# Patient Record
Sex: Female | Born: 1985 | Race: Black or African American | Hispanic: No | Marital: Single | Smoking: Never smoker
Health system: Southern US, Community
[De-identification: ages and names within clinical notes are randomized; demographics above are authoritative.]

## PROBLEM LIST (undated history)

## (undated) DIAGNOSIS — R5600 Simple febrile convulsions: Secondary | ICD-10-CM

## (undated) DIAGNOSIS — O020 Blighted ovum and nonhydatidiform mole: Secondary | ICD-10-CM

## (undated) DIAGNOSIS — O139 Gestational [pregnancy-induced] hypertension without significant proteinuria, unspecified trimester: Secondary | ICD-10-CM

## (undated) DIAGNOSIS — I8393 Asymptomatic varicose veins of bilateral lower extremities: Secondary | ICD-10-CM

## (undated) DIAGNOSIS — E079 Disorder of thyroid, unspecified: Secondary | ICD-10-CM

## (undated) DIAGNOSIS — O43819 Placental infarction, unspecified trimester: Secondary | ICD-10-CM

## (undated) DIAGNOSIS — Z8759 Personal history of other complications of pregnancy, childbirth and the puerperium: Secondary | ICD-10-CM

## (undated) HISTORY — DX: Placental infarction, unspecified trimester: O43.819

---
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
## 2006-06-08 ENCOUNTER — Emergency Department (HOSPITAL_COMMUNITY): Admission: EM | Admit: 2006-06-08 | Discharge: 2006-06-08 | Payer: Self-pay | Admitting: Emergency Medicine

## 2006-11-16 ENCOUNTER — Ambulatory Visit (HOSPITAL_COMMUNITY): Admission: RE | Admit: 2006-11-16 | Discharge: 2006-11-16 | Payer: Self-pay | Admitting: Family Medicine

## 2006-12-07 ENCOUNTER — Ambulatory Visit (HOSPITAL_COMMUNITY): Admission: RE | Admit: 2006-12-07 | Discharge: 2006-12-07 | Payer: Self-pay | Admitting: Family Medicine

## 2006-12-28 ENCOUNTER — Ambulatory Visit (HOSPITAL_COMMUNITY): Admission: RE | Admit: 2006-12-28 | Discharge: 2006-12-28 | Payer: Self-pay | Admitting: Family Medicine

## 2007-01-26 ENCOUNTER — Ambulatory Visit (HOSPITAL_COMMUNITY): Admission: RE | Admit: 2007-01-26 | Discharge: 2007-01-26 | Payer: Self-pay | Admitting: Family Medicine

## 2007-03-02 ENCOUNTER — Ambulatory Visit (HOSPITAL_COMMUNITY): Admission: RE | Admit: 2007-03-02 | Discharge: 2007-03-02 | Payer: Self-pay | Admitting: Family Medicine

## 2007-04-13 ENCOUNTER — Ambulatory Visit (HOSPITAL_COMMUNITY): Admission: RE | Admit: 2007-04-13 | Discharge: 2007-04-13 | Payer: Self-pay | Admitting: Family Medicine

## 2007-05-04 ENCOUNTER — Inpatient Hospital Stay (HOSPITAL_COMMUNITY): Admission: AD | Admit: 2007-05-04 | Discharge: 2007-05-08 | Payer: Self-pay | Admitting: Obstetrics and Gynecology

## 2007-05-04 ENCOUNTER — Ambulatory Visit: Payer: Self-pay | Admitting: Physician Assistant

## 2007-06-27 ENCOUNTER — Emergency Department (HOSPITAL_COMMUNITY): Admission: EM | Admit: 2007-06-27 | Discharge: 2007-06-27 | Payer: Self-pay | Admitting: Emergency Medicine

## 2009-09-01 DIAGNOSIS — O020 Blighted ovum and nonhydatidiform mole: Secondary | ICD-10-CM

## 2009-09-01 HISTORY — DX: Blighted ovum and nonhydatidiform mole: O02.0

## 2009-09-01 HISTORY — PX: DILATION AND EVACUATION: SHX1459

## 2009-09-14 ENCOUNTER — Inpatient Hospital Stay (HOSPITAL_COMMUNITY): Admission: AD | Admit: 2009-09-14 | Discharge: 2009-09-14 | Payer: Self-pay | Admitting: Obstetrics & Gynecology

## 2009-09-14 ENCOUNTER — Ambulatory Visit: Payer: Self-pay | Admitting: Obstetrics and Gynecology

## 2009-09-17 ENCOUNTER — Ambulatory Visit: Payer: Self-pay | Admitting: Obstetrics & Gynecology

## 2009-09-17 ENCOUNTER — Ambulatory Visit (HOSPITAL_COMMUNITY): Admission: AD | Admit: 2009-09-17 | Discharge: 2009-09-17 | Payer: Self-pay | Admitting: Obstetrics & Gynecology

## 2009-09-21 ENCOUNTER — Emergency Department (HOSPITAL_COMMUNITY): Admission: EM | Admit: 2009-09-21 | Discharge: 2009-09-21 | Payer: Self-pay | Admitting: Emergency Medicine

## 2009-10-04 ENCOUNTER — Ambulatory Visit: Payer: Self-pay | Admitting: Obstetrics and Gynecology

## 2009-10-05 ENCOUNTER — Encounter: Payer: Self-pay | Admitting: Obstetrics and Gynecology

## 2009-10-18 ENCOUNTER — Ambulatory Visit: Payer: Self-pay | Admitting: Obstetrics and Gynecology

## 2009-10-18 LAB — CONVERTED CEMR LAB: hCG, Beta Chain, Quant, S: 25.2 milliintl units/mL

## 2009-10-25 ENCOUNTER — Ambulatory Visit: Payer: Self-pay | Admitting: Obstetrics and Gynecology

## 2009-10-26 ENCOUNTER — Encounter: Payer: Self-pay | Admitting: Obstetrics and Gynecology

## 2009-11-07 ENCOUNTER — Ambulatory Visit: Payer: Self-pay | Admitting: Obstetrics & Gynecology

## 2009-11-07 ENCOUNTER — Encounter: Payer: Self-pay | Admitting: Obstetrics and Gynecology

## 2009-11-14 ENCOUNTER — Ambulatory Visit: Payer: Self-pay | Admitting: Obstetrics & Gynecology

## 2009-12-14 ENCOUNTER — Ambulatory Visit: Payer: Self-pay | Admitting: Obstetrics & Gynecology

## 2009-12-14 ENCOUNTER — Encounter: Payer: Self-pay | Admitting: Obstetrics and Gynecology

## 2009-12-14 LAB — CONVERTED CEMR LAB: hCG, Beta Chain, Quant, S: <2 milliintl units/mL

## 2010-03-15 ENCOUNTER — Encounter: Payer: Self-pay | Admitting: Obstetrics and Gynecology

## 2010-03-15 ENCOUNTER — Ambulatory Visit: Payer: Self-pay | Admitting: Obstetrics & Gynecology

## 2010-03-15 LAB — CONVERTED CEMR LAB: hCG, Beta Chain, Quant, S: <2 milliintl units/mL

## 2010-06-21 ENCOUNTER — Ambulatory Visit: Payer: Self-pay | Admitting: Obstetrics & Gynecology

## 2010-06-21 LAB — CONVERTED CEMR LAB: hCG, Beta Chain, Quant, S: <2 milliintl units/mL

## 2010-09-16 ENCOUNTER — Ambulatory Visit
Admission: RE | Admit: 2010-09-16 | Discharge: 2010-09-16 | Payer: Self-pay | Source: Home / Self Care | Attending: Obstetrics & Gynecology | Admitting: Obstetrics & Gynecology

## 2010-09-16 ENCOUNTER — Encounter: Payer: Self-pay | Admitting: Obstetrics & Gynecology

## 2010-11-17 LAB — CBC
HCT: 34.3 % — ABNORMAL LOW (ref 36.0–46.0)
Hemoglobin: 11.6 g/dL — ABNORMAL LOW (ref 12.0–15.0)
MCHC: 34.2 g/dL (ref 30.0–36.0)
MCV: 89 fL (ref 78.0–100.0)
Platelets: 215 10*3/uL (ref 150–400)
RBC: 3.85 MIL/uL — ABNORMAL LOW (ref 3.87–5.11)
RDW: 13.5 % (ref 11.5–15.5)

## 2010-11-17 LAB — POCT I-STAT, CHEM 8
Glucose, Bld: 109 mg/dL — ABNORMAL HIGH (ref 70–99)
HCT: 34 % — ABNORMAL LOW (ref 36.0–46.0)
Hemoglobin: 11.6 g/dL — ABNORMAL LOW (ref 12.0–15.0)
TCO2: 21 mmol/L (ref 0–100)

## 2010-11-17 LAB — CROSSMATCH

## 2010-11-17 LAB — WET PREP, GENITAL
Clue Cells Wet Prep HPF POC: NONE SEEN
Yeast Wet Prep HPF POC: NONE SEEN

## 2010-11-17 LAB — T3, FREE: T3, Free: 2.5 pg/mL (ref 2.3–4.2)

## 2010-11-17 LAB — ABO/RH: ABO/RH(D): B POS

## 2010-11-17 LAB — DIFFERENTIAL
Basophils Absolute: 0 10*3/uL (ref 0.0–0.1)
Basophils Relative: 0 % (ref 0–1)
Eosinophils Absolute: 0 10*3/uL (ref 0.0–0.7)
Lymphocytes Relative: 8 % — ABNORMAL LOW (ref 12–46)
Lymphs Abs: 0.6 10*3/uL — ABNORMAL LOW (ref 0.7–4.0)
Neutro Abs: 5.4 10*3/uL (ref 1.7–7.7)
Neutrophils Relative %: 82 % — ABNORMAL HIGH (ref 43–77)

## 2010-11-17 LAB — GC/CHLAMYDIA PROBE AMP, GENITAL
Chlamydia, DNA Probe: NEGATIVE
GC Probe Amp, Genital: NEGATIVE

## 2010-11-17 LAB — HCG, QUANTITATIVE, PREGNANCY: hCG, Beta Chain, Quant, S: 488272 m[IU]/mL — ABNORMAL HIGH (ref <5)

## 2010-11-17 LAB — POCT PREGNANCY, URINE: Preg Test, Ur: POSITIVE

## 2011-06-13 LAB — CBC
HCT: 33.3 — ABNORMAL LOW
Hemoglobin: 11.6 — ABNORMAL LOW
MCV: 88.7
RBC: 3.76 — ABNORMAL LOW
WBC: 6.5

## 2017-09-01 NOTE — L&D Delivery Note (Signed)
Delivery Note Progressed quickly to complete dilation.   Nursery team called and arrived  At 5:37 PM a viable and healthy female was delivered via Vaginal, Spontaneous (Presentation:ROA with compound hand ).  APGAR: 9, ; weight  .   Placenta status: Spontaneous and grossly intact with 3 vessel Cord:  with the following complications: none  Cord pH: Pending  Anesthesia:  epidural Episiotomy:  none Lacerations: Labial abrasion;1st degree perineal  Suture Repair: none Est. Blood Loss (mL): 75  Mom to postpartum.  Baby to Couplet care / Skin to Skin.  Wynelle Bourgeois 06/06/2018, 6:08 PM  Please schedule this patient for Postpartum visit in: 4 weeks with the following provider: Any provider For C/S patients schedule nurse incision check in weeks 2 weeks: no Low risk pregnancy complicated by: preterm rupture of membranes and PTL Delivery mode:  SVD Anticipated Birth Control:  other/unsure PP Procedures needed: none  Schedule Integrated BH visit: no

## 2017-12-28 DIAGNOSIS — Z23 Encounter for immunization: Secondary | ICD-10-CM | POA: Diagnosis not present

## 2017-12-28 DIAGNOSIS — R824 Acetonuria: Secondary | ICD-10-CM | POA: Diagnosis not present

## 2017-12-28 DIAGNOSIS — E079 Disorder of thyroid, unspecified: Secondary | ICD-10-CM | POA: Diagnosis not present

## 2017-12-28 DIAGNOSIS — Z789 Other specified health status: Secondary | ICD-10-CM | POA: Diagnosis not present

## 2017-12-28 DIAGNOSIS — O2200 Varicose veins of lower extremity in pregnancy, unspecified trimester: Secondary | ICD-10-CM | POA: Diagnosis not present

## 2017-12-28 DIAGNOSIS — E669 Obesity, unspecified: Secondary | ICD-10-CM | POA: Diagnosis not present

## 2017-12-28 DIAGNOSIS — O09299 Supervision of pregnancy with other poor reproductive or obstetric history, unspecified trimester: Secondary | ICD-10-CM | POA: Diagnosis not present

## 2017-12-28 DIAGNOSIS — O99810 Abnormal glucose complicating pregnancy: Secondary | ICD-10-CM | POA: Diagnosis not present

## 2017-12-28 DIAGNOSIS — Z3481 Encounter for supervision of other normal pregnancy, first trimester: Secondary | ICD-10-CM | POA: Diagnosis not present

## 2017-12-29 ENCOUNTER — Other Ambulatory Visit (HOSPITAL_COMMUNITY): Payer: Self-pay | Admitting: Family

## 2017-12-29 DIAGNOSIS — Z369 Encounter for antenatal screening, unspecified: Secondary | ICD-10-CM

## 2018-01-14 ENCOUNTER — Encounter (HOSPITAL_COMMUNITY): Payer: Self-pay | Admitting: *Deleted

## 2018-01-15 ENCOUNTER — Ambulatory Visit (HOSPITAL_COMMUNITY)
Admission: RE | Admit: 2018-01-15 | Discharge: 2018-01-15 | Disposition: A | Discharge status: Home / Self Care | Payer: BLUE CROSS/BLUE SHIELD | Source: Ambulatory Visit | Attending: Family | Admitting: Family

## 2018-01-15 ENCOUNTER — Encounter (HOSPITAL_COMMUNITY): Payer: Self-pay

## 2018-01-15 ENCOUNTER — Other Ambulatory Visit: Payer: Self-pay

## 2018-01-15 ENCOUNTER — Other Ambulatory Visit (HOSPITAL_COMMUNITY): Payer: Self-pay | Admitting: Family

## 2018-01-15 DIAGNOSIS — O09299 Supervision of pregnancy with other poor reproductive or obstetric history, unspecified trimester: Secondary | ICD-10-CM

## 2018-01-15 DIAGNOSIS — Z369 Encounter for antenatal screening, unspecified: Secondary | ICD-10-CM | POA: Insufficient documentation

## 2018-01-15 DIAGNOSIS — O09291 Supervision of pregnancy with other poor reproductive or obstetric history, first trimester: Secondary | ICD-10-CM | POA: Insufficient documentation

## 2018-01-15 DIAGNOSIS — O99211 Obesity complicating pregnancy, first trimester: Secondary | ICD-10-CM | POA: Insufficient documentation

## 2018-01-15 DIAGNOSIS — Z3A13 13 weeks gestation of pregnancy: Secondary | ICD-10-CM | POA: Diagnosis not present

## 2018-01-15 DIAGNOSIS — Z3689 Encounter for other specified antenatal screening: Secondary | ICD-10-CM | POA: Diagnosis not present

## 2018-01-15 DIAGNOSIS — Z3682 Encounter for antenatal screening for nuchal translucency: Secondary | ICD-10-CM

## 2018-01-15 DIAGNOSIS — Z3491 Encounter for supervision of normal pregnancy, unspecified, first trimester: Secondary | ICD-10-CM

## 2018-01-15 HISTORY — DX: Blighted ovum and nonhydatidiform mole: O02.0

## 2018-01-15 HISTORY — DX: Simple febrile convulsions: R56.00

## 2018-01-15 HISTORY — DX: Asymptomatic varicose veins of bilateral lower extremities: I83.93

## 2018-01-15 HISTORY — DX: Disorder of thyroid, unspecified: E07.9

## 2018-03-01 ENCOUNTER — Other Ambulatory Visit: Payer: Self-pay | Admitting: Family Medicine

## 2018-03-01 DIAGNOSIS — Z363 Encounter for antenatal screening for malformations: Secondary | ICD-10-CM

## 2018-03-01 DIAGNOSIS — Z3A20 20 weeks gestation of pregnancy: Secondary | ICD-10-CM

## 2018-03-01 DIAGNOSIS — O280 Abnormal hematological finding on antenatal screening of mother: Secondary | ICD-10-CM

## 2018-03-08 ENCOUNTER — Encounter (HOSPITAL_COMMUNITY): Payer: Self-pay

## 2018-03-10 ENCOUNTER — Encounter (HOSPITAL_COMMUNITY): Payer: Self-pay

## 2018-03-10 ENCOUNTER — Other Ambulatory Visit: Payer: Self-pay

## 2018-03-10 ENCOUNTER — Other Ambulatory Visit (HOSPITAL_COMMUNITY): Payer: Self-pay | Admitting: *Deleted

## 2018-03-10 ENCOUNTER — Ambulatory Visit (HOSPITAL_COMMUNITY)
Admission: RE | Admit: 2018-03-10 | Discharge: 2018-03-10 | Disposition: A | Discharge status: Home / Self Care | Payer: BLUE CROSS/BLUE SHIELD | Source: Ambulatory Visit | Attending: Family Medicine | Admitting: Family Medicine

## 2018-03-10 DIAGNOSIS — Z3A20 20 weeks gestation of pregnancy: Secondary | ICD-10-CM | POA: Insufficient documentation

## 2018-03-10 DIAGNOSIS — O09293 Supervision of pregnancy with other poor reproductive or obstetric history, third trimester: Secondary | ICD-10-CM | POA: Diagnosis not present

## 2018-03-10 DIAGNOSIS — Z363 Encounter for antenatal screening for malformations: Secondary | ICD-10-CM | POA: Diagnosis not present

## 2018-03-10 DIAGNOSIS — R772 Abnormality of alphafetoprotein: Secondary | ICD-10-CM

## 2018-03-10 DIAGNOSIS — O280 Abnormal hematological finding on antenatal screening of mother: Secondary | ICD-10-CM

## 2018-04-07 ENCOUNTER — Ambulatory Visit (HOSPITAL_COMMUNITY): Payer: BLUE CROSS/BLUE SHIELD

## 2018-04-08 ENCOUNTER — Ambulatory Visit (HOSPITAL_COMMUNITY)
Admission: RE | Admit: 2018-04-08 | Discharge: 2018-04-08 | Disposition: A | Discharge status: Home / Self Care | Payer: Medicaid Other | Source: Ambulatory Visit | Attending: Family Medicine | Admitting: Family Medicine

## 2018-04-08 ENCOUNTER — Encounter (HOSPITAL_COMMUNITY): Payer: Self-pay

## 2018-04-08 DIAGNOSIS — O09292 Supervision of pregnancy with other poor reproductive or obstetric history, second trimester: Secondary | ICD-10-CM

## 2018-04-08 DIAGNOSIS — O289 Unspecified abnormal findings on antenatal screening of mother: Secondary | ICD-10-CM

## 2018-04-08 DIAGNOSIS — R772 Abnormality of alphafetoprotein: Secondary | ICD-10-CM

## 2018-04-08 DIAGNOSIS — Z363 Encounter for antenatal screening for malformations: Secondary | ICD-10-CM

## 2018-04-08 DIAGNOSIS — Z3A25 25 weeks gestation of pregnancy: Secondary | ICD-10-CM | POA: Diagnosis not present

## 2018-04-15 DIAGNOSIS — O99810 Abnormal glucose complicating pregnancy: Secondary | ICD-10-CM | POA: Diagnosis not present

## 2018-04-15 DIAGNOSIS — O09299 Supervision of pregnancy with other poor reproductive or obstetric history, unspecified trimester: Secondary | ICD-10-CM | POA: Diagnosis not present

## 2018-04-15 DIAGNOSIS — Z23 Encounter for immunization: Secondary | ICD-10-CM | POA: Diagnosis not present

## 2018-04-15 DIAGNOSIS — E669 Obesity, unspecified: Secondary | ICD-10-CM | POA: Diagnosis not present

## 2018-04-15 DIAGNOSIS — Z789 Other specified health status: Secondary | ICD-10-CM | POA: Diagnosis not present

## 2018-04-15 DIAGNOSIS — O2200 Varicose veins of lower extremity in pregnancy, unspecified trimester: Secondary | ICD-10-CM | POA: Diagnosis not present

## 2018-04-15 DIAGNOSIS — R824 Acetonuria: Secondary | ICD-10-CM | POA: Diagnosis not present

## 2018-04-15 DIAGNOSIS — Z3482 Encounter for supervision of other normal pregnancy, second trimester: Secondary | ICD-10-CM | POA: Diagnosis not present

## 2018-04-15 DIAGNOSIS — E079 Disorder of thyroid, unspecified: Secondary | ICD-10-CM | POA: Diagnosis not present

## 2018-04-15 DIAGNOSIS — O285 Abnormal chromosomal and genetic finding on antenatal screening of mother: Secondary | ICD-10-CM | POA: Diagnosis not present

## 2018-05-05 DIAGNOSIS — O99810 Abnormal glucose complicating pregnancy: Secondary | ICD-10-CM | POA: Diagnosis not present

## 2018-05-05 DIAGNOSIS — Z3483 Encounter for supervision of other normal pregnancy, third trimester: Secondary | ICD-10-CM | POA: Diagnosis not present

## 2018-05-05 DIAGNOSIS — E669 Obesity, unspecified: Secondary | ICD-10-CM | POA: Diagnosis not present

## 2018-05-05 DIAGNOSIS — O285 Abnormal chromosomal and genetic finding on antenatal screening of mother: Secondary | ICD-10-CM | POA: Diagnosis not present

## 2018-05-05 DIAGNOSIS — E079 Disorder of thyroid, unspecified: Secondary | ICD-10-CM | POA: Diagnosis not present

## 2018-05-05 DIAGNOSIS — R824 Acetonuria: Secondary | ICD-10-CM | POA: Diagnosis not present

## 2018-05-05 DIAGNOSIS — Z789 Other specified health status: Secondary | ICD-10-CM | POA: Diagnosis not present

## 2018-05-05 DIAGNOSIS — Z23 Encounter for immunization: Secondary | ICD-10-CM | POA: Diagnosis not present

## 2018-05-05 DIAGNOSIS — O09299 Supervision of pregnancy with other poor reproductive or obstetric history, unspecified trimester: Secondary | ICD-10-CM | POA: Diagnosis not present

## 2018-05-05 DIAGNOSIS — O2200 Varicose veins of lower extremity in pregnancy, unspecified trimester: Secondary | ICD-10-CM | POA: Diagnosis not present

## 2018-05-06 ENCOUNTER — Encounter (HOSPITAL_COMMUNITY): Payer: Self-pay

## 2018-05-06 ENCOUNTER — Ambulatory Visit (HOSPITAL_COMMUNITY)
Admission: RE | Admit: 2018-05-06 | Discharge: 2018-05-06 | Disposition: A | Discharge status: Home / Self Care | Payer: Medicaid Other | Source: Ambulatory Visit | Attending: Family | Admitting: Family

## 2018-05-06 ENCOUNTER — Ambulatory Visit (HOSPITAL_COMMUNITY): Payer: BLUE CROSS/BLUE SHIELD

## 2018-05-06 DIAGNOSIS — O09293 Supervision of pregnancy with other poor reproductive or obstetric history, third trimester: Secondary | ICD-10-CM

## 2018-05-06 DIAGNOSIS — O289 Unspecified abnormal findings on antenatal screening of mother: Secondary | ICD-10-CM | POA: Diagnosis not present

## 2018-05-06 DIAGNOSIS — Z362 Encounter for other antenatal screening follow-up: Secondary | ICD-10-CM | POA: Diagnosis not present

## 2018-05-06 DIAGNOSIS — Z3A29 29 weeks gestation of pregnancy: Secondary | ICD-10-CM

## 2018-05-06 DIAGNOSIS — R772 Abnormality of alphafetoprotein: Secondary | ICD-10-CM | POA: Diagnosis not present

## 2018-05-07 ENCOUNTER — Other Ambulatory Visit (HOSPITAL_COMMUNITY): Payer: Self-pay | Admitting: *Deleted

## 2018-05-07 DIAGNOSIS — R772 Abnormality of alphafetoprotein: Secondary | ICD-10-CM

## 2018-05-19 DIAGNOSIS — Z3483 Encounter for supervision of other normal pregnancy, third trimester: Secondary | ICD-10-CM | POA: Diagnosis not present

## 2018-05-19 DIAGNOSIS — O99810 Abnormal glucose complicating pregnancy: Secondary | ICD-10-CM | POA: Diagnosis not present

## 2018-05-19 DIAGNOSIS — O285 Abnormal chromosomal and genetic finding on antenatal screening of mother: Secondary | ICD-10-CM | POA: Diagnosis not present

## 2018-05-19 DIAGNOSIS — R824 Acetonuria: Secondary | ICD-10-CM | POA: Diagnosis not present

## 2018-05-19 DIAGNOSIS — O2200 Varicose veins of lower extremity in pregnancy, unspecified trimester: Secondary | ICD-10-CM | POA: Diagnosis not present

## 2018-05-19 DIAGNOSIS — O09299 Supervision of pregnancy with other poor reproductive or obstetric history, unspecified trimester: Secondary | ICD-10-CM | POA: Diagnosis not present

## 2018-05-19 DIAGNOSIS — E079 Disorder of thyroid, unspecified: Secondary | ICD-10-CM | POA: Diagnosis not present

## 2018-05-19 DIAGNOSIS — E669 Obesity, unspecified: Secondary | ICD-10-CM | POA: Diagnosis not present

## 2018-05-19 DIAGNOSIS — Z789 Other specified health status: Secondary | ICD-10-CM | POA: Diagnosis not present

## 2018-05-27 ENCOUNTER — Encounter (HOSPITAL_COMMUNITY): Payer: Self-pay

## 2018-05-27 ENCOUNTER — Ambulatory Visit (HOSPITAL_COMMUNITY)
Admission: RE | Admit: 2018-05-27 | Discharge: 2018-05-27 | Disposition: A | Discharge status: Home / Self Care | Payer: Medicaid Other | Source: Ambulatory Visit | Attending: Family | Admitting: Family

## 2018-05-27 DIAGNOSIS — Z3A32 32 weeks gestation of pregnancy: Secondary | ICD-10-CM | POA: Diagnosis not present

## 2018-05-27 DIAGNOSIS — O289 Unspecified abnormal findings on antenatal screening of mother: Secondary | ICD-10-CM | POA: Diagnosis not present

## 2018-05-27 DIAGNOSIS — O09293 Supervision of pregnancy with other poor reproductive or obstetric history, third trimester: Secondary | ICD-10-CM | POA: Insufficient documentation

## 2018-05-27 DIAGNOSIS — R772 Abnormality of alphafetoprotein: Secondary | ICD-10-CM

## 2018-05-28 ENCOUNTER — Other Ambulatory Visit (HOSPITAL_COMMUNITY): Payer: Self-pay | Admitting: *Deleted

## 2018-05-28 DIAGNOSIS — O280 Abnormal hematological finding on antenatal screening of mother: Secondary | ICD-10-CM

## 2018-06-04 ENCOUNTER — Encounter (HOSPITAL_COMMUNITY): Payer: Self-pay

## 2018-06-04 ENCOUNTER — Other Ambulatory Visit (HOSPITAL_COMMUNITY): Payer: BLUE CROSS/BLUE SHIELD

## 2018-06-06 ENCOUNTER — Inpatient Hospital Stay (HOSPITAL_COMMUNITY): Payer: Medicaid Other | Admitting: Anesthesiology

## 2018-06-06 ENCOUNTER — Inpatient Hospital Stay (HOSPITAL_COMMUNITY)
Admission: AD | Admit: 2018-06-06 | Discharge: 2018-06-08 | DRG: 805 | Disposition: A | Discharge status: Home / Self Care | Payer: Medicaid Other | Attending: Obstetrics & Gynecology | Admitting: Obstetrics & Gynecology

## 2018-06-06 ENCOUNTER — Encounter (HOSPITAL_COMMUNITY): Payer: Self-pay | Admitting: *Deleted

## 2018-06-06 ENCOUNTER — Other Ambulatory Visit: Payer: Self-pay

## 2018-06-06 DIAGNOSIS — O42919 Preterm premature rupture of membranes, unspecified as to length of time between rupture and onset of labor, unspecified trimester: Secondary | ICD-10-CM

## 2018-06-06 DIAGNOSIS — Z3A33 33 weeks gestation of pregnancy: Secondary | ICD-10-CM

## 2018-06-06 DIAGNOSIS — O326XX0 Maternal care for compound presentation, not applicable or unspecified: Secondary | ICD-10-CM | POA: Diagnosis present

## 2018-06-06 DIAGNOSIS — O43813 Placental infarction, third trimester: Secondary | ICD-10-CM | POA: Diagnosis not present

## 2018-06-06 DIAGNOSIS — O09899 Supervision of other high risk pregnancies, unspecified trimester: Secondary | ICD-10-CM | POA: Diagnosis present

## 2018-06-06 DIAGNOSIS — Z23 Encounter for immunization: Secondary | ICD-10-CM

## 2018-06-06 DIAGNOSIS — O42913 Preterm premature rupture of membranes, unspecified as to length of time between rupture and onset of labor, third trimester: Secondary | ICD-10-CM | POA: Diagnosis present

## 2018-06-06 DIAGNOSIS — O09219 Supervision of pregnancy with history of pre-term labor, unspecified trimester: Secondary | ICD-10-CM

## 2018-06-06 HISTORY — DX: Supervision of other high risk pregnancies, unspecified trimester: O09.899

## 2018-06-06 LAB — AMNISURE RUPTURE OF MEMBRANE (ROM) NOT AT ARMC: Amnisure ROM: POSITIVE

## 2018-06-06 LAB — CBC
HEMATOCRIT: 37.5 % (ref 36.0–46.0)
HEMOGLOBIN: 12.8 g/dL (ref 12.0–15.0)
MCH: 29.9 pg (ref 26.0–34.0)
MCHC: 34.1 g/dL (ref 30.0–36.0)
MCV: 87.6 fL (ref 78.0–100.0)
Platelets: 322 10*3/uL (ref 150–400)
RBC: 4.28 MIL/uL (ref 3.87–5.11)
RDW: 13.5 % (ref 11.5–15.5)
WBC: 6.7 10*3/uL (ref 4.0–10.5)

## 2018-06-06 LAB — URINALYSIS, ROUTINE W REFLEX MICROSCOPIC
BILIRUBIN URINE: NEGATIVE
GLUCOSE, UA: NEGATIVE mg/dL
Hgb urine dipstick: NEGATIVE
KETONES UR: 20 mg/dL — AB
Leukocytes, UA: NEGATIVE
Nitrite: NEGATIVE
PH: 6 (ref 5.0–8.0)
Protein, ur: NEGATIVE mg/dL
Specific Gravity, Urine: 1.016 (ref 1.005–1.030)

## 2018-06-06 LAB — TYPE AND SCREEN
ABO/RH(D): B POS
ANTIBODY SCREEN: NEGATIVE

## 2018-06-06 LAB — WET PREP, GENITAL
Clue Cells Wet Prep HPF POC: NONE SEEN
SPERM: NONE SEEN
Trich, Wet Prep: NONE SEEN
Yeast Wet Prep HPF POC: NONE SEEN

## 2018-06-06 MED ORDER — IBUPROFEN 600 MG PO TABS
600.0000 mg | ORAL_TABLET | Freq: Four times a day (QID) | ORAL | Status: DC
  Administered 2018-06-07 – 2018-06-08 (×7): 600 mg via ORAL
  Filled 2018-06-06 (×6): qty 1

## 2018-06-06 MED ORDER — BETAMETHASONE SOD PHOS & ACET 6 (3-3) MG/ML IJ SUSP: 12.0000 mg | Freq: Once | INTRAMUSCULAR | Status: DC

## 2018-06-06 MED ORDER — BENZOCAINE-MENTHOL 20-0.5 % EX AERO
1.0000 "application " | INHALATION_SPRAY | CUTANEOUS | Status: DC | PRN
  Administered 2018-06-06: 1 via TOPICAL
  Filled 2018-06-06: qty 56

## 2018-06-06 MED ORDER — LACTATED RINGERS IV BOLUS: 1000.0000 mL | Freq: Once | INTRAVENOUS | Status: DC

## 2018-06-06 MED ORDER — PHENYLEPHRINE 40 MCG/ML (10ML) SYRINGE FOR IV PUSH (FOR BLOOD PRESSURE SUPPORT)
80.0000 ug | PREFILLED_SYRINGE | INTRAVENOUS | Status: DC | PRN
  Filled 2018-06-06: qty 5

## 2018-06-06 MED ORDER — LACTATED RINGERS IV SOLN
INTRAVENOUS | Status: DC
  Administered 2018-06-06: 16:00:00 via INTRAVENOUS

## 2018-06-06 MED ORDER — LIDOCAINE HCL (PF) 1 % IJ SOLN
INTRAMUSCULAR | Status: DC | PRN
  Administered 2018-06-06: 5 mL via EPIDURAL

## 2018-06-06 MED ORDER — ACETAMINOPHEN 325 MG PO TABS
650.0000 mg | ORAL_TABLET | ORAL | Status: DC | PRN
  Administered 2018-06-06 – 2018-06-08 (×2): 650 mg via ORAL
  Filled 2018-06-06 (×2): qty 2

## 2018-06-06 MED ORDER — EPHEDRINE 5 MG/ML INJ: 10.0000 mg | INTRAVENOUS | Status: DC | PRN

## 2018-06-06 MED ORDER — ACETAMINOPHEN 325 MG PO TABS: 650.0000 mg | ORAL_TABLET | ORAL | Status: DC | PRN

## 2018-06-06 MED ORDER — SODIUM CHLORIDE 0.9 % IV SOLN
500.0000 mg | Freq: Once | INTRAVENOUS | Status: AC
  Administered 2018-06-06: 500 mg via INTRAVENOUS
  Filled 2018-06-06: qty 500

## 2018-06-06 MED ORDER — SOD CITRATE-CITRIC ACID 500-334 MG/5ML PO SOLN: 30.0000 mL | ORAL | Status: DC | PRN

## 2018-06-06 MED ORDER — INFLUENZA VAC SPLIT QUAD 0.5 ML IM SUSY
0.5000 mL | PREFILLED_SYRINGE | INTRAMUSCULAR | Status: AC | PRN
  Administered 2018-06-08: 0.5 mL via INTRAMUSCULAR
  Filled 2018-06-06: qty 0.5

## 2018-06-06 MED ORDER — SENNOSIDES-DOCUSATE SODIUM 8.6-50 MG PO TABS
2.0000 | ORAL_TABLET | ORAL | Status: DC
  Administered 2018-06-07 – 2018-06-08 (×2): 2 via ORAL
  Filled 2018-06-06: qty 2

## 2018-06-06 MED ORDER — OXYCODONE-ACETAMINOPHEN 5-325 MG PO TABS: 1.0000 | ORAL_TABLET | ORAL | Status: DC | PRN

## 2018-06-06 MED ORDER — ZOLPIDEM TARTRATE 5 MG PO TABS: 5.0000 mg | ORAL_TABLET | Freq: Every evening | ORAL | Status: DC | PRN

## 2018-06-06 MED ORDER — LIDOCAINE HCL (PF) 1 % IJ SOLN
30.0000 mL | INTRAMUSCULAR | Status: DC | PRN
  Filled 2018-06-06: qty 30

## 2018-06-06 MED ORDER — LACTATED RINGERS IV SOLN
500.0000 mL | Freq: Once | INTRAVENOUS | Status: AC
  Administered 2018-06-06: 500 mL via INTRAVENOUS

## 2018-06-06 MED ORDER — DIPHENHYDRAMINE HCL 50 MG/ML IJ SOLN: 12.5000 mg | INTRAMUSCULAR | Status: DC | PRN

## 2018-06-06 MED ORDER — SIMETHICONE 80 MG PO CHEW: 80.0000 mg | CHEWABLE_TABLET | ORAL | Status: DC | PRN

## 2018-06-06 MED ORDER — FENTANYL CITRATE (PF) 100 MCG/2ML IJ SOLN
50.0000 ug | INTRAMUSCULAR | Status: DC | PRN
  Administered 2018-06-06: 50 ug via INTRAVENOUS
  Filled 2018-06-06: qty 2

## 2018-06-06 MED ORDER — PENICILLIN G 3 MILLION UNITS IVPB - SIMPLE MED
3.0000 10*6.[IU] | INTRAVENOUS | Status: DC
  Filled 2018-06-06 (×2): qty 100

## 2018-06-06 MED ORDER — PRENATAL MULTIVITAMIN CH
1.0000 | ORAL_TABLET | Freq: Every day | ORAL | Status: DC
  Administered 2018-06-07 – 2018-06-08 (×2): 1 via ORAL
  Filled 2018-06-06 (×2): qty 1

## 2018-06-06 MED ORDER — ONDANSETRON HCL 4 MG PO TABS: 4.0000 mg | ORAL_TABLET | ORAL | Status: DC | PRN

## 2018-06-06 MED ORDER — SODIUM CHLORIDE 0.9 % IV SOLN
5.0000 10*6.[IU] | Freq: Once | INTRAVENOUS | Status: DC
  Filled 2018-06-06: qty 5

## 2018-06-06 MED ORDER — COCONUT OIL OIL: 1.0000 "application " | TOPICAL_OIL | Status: DC | PRN

## 2018-06-06 MED ORDER — SODIUM CHLORIDE 0.9 % IV SOLN
2.0000 g | Freq: Four times a day (QID) | INTRAVENOUS | Status: DC
  Filled 2018-06-06 (×2): qty 2000

## 2018-06-06 MED ORDER — NIFEDIPINE 10 MG PO CAPS: 10.0000 mg | ORAL_CAPSULE | ORAL | Status: DC

## 2018-06-06 MED ORDER — BETAMETHASONE SOD PHOS & ACET 6 (3-3) MG/ML IJ SUSP
12.0000 mg | Freq: Once | INTRAMUSCULAR | Status: AC
Start: 2018-06-06 — End: 2018-06-06
  Administered 2018-06-06: 12 mg via INTRAMUSCULAR
  Filled 2018-06-06: qty 2

## 2018-06-06 MED ORDER — EPHEDRINE 5 MG/ML INJ
10.0000 mg | INTRAVENOUS | Status: DC | PRN
  Filled 2018-06-06: qty 2

## 2018-06-06 MED ORDER — TETANUS-DIPHTH-ACELL PERTUSSIS 5-2.5-18.5 LF-MCG/0.5 IM SUSP: 0.5000 mL | Freq: Once | INTRAMUSCULAR | Status: DC

## 2018-06-06 MED ORDER — LACTATED RINGERS IV SOLN: 500.0000 mL | Freq: Once | INTRAVENOUS | Status: DC

## 2018-06-06 MED ORDER — OXYTOCIN BOLUS FROM INFUSION
500.0000 mL | Freq: Once | INTRAVENOUS | Status: AC
  Administered 2018-06-06: 500 mL via INTRAVENOUS

## 2018-06-06 MED ORDER — DIPHENHYDRAMINE HCL 25 MG PO CAPS: 25.0000 mg | ORAL_CAPSULE | Freq: Four times a day (QID) | ORAL | Status: DC | PRN

## 2018-06-06 MED ORDER — PHENYLEPHRINE 40 MCG/ML (10ML) SYRINGE FOR IV PUSH (FOR BLOOD PRESSURE SUPPORT)
80.0000 ug | PREFILLED_SYRINGE | INTRAVENOUS | Status: DC | PRN
  Filled 2018-06-06: qty 5
  Filled 2018-06-06: qty 10

## 2018-06-06 MED ORDER — DEXTROSE 5 % IV SOLN
1000.0000 mg | Freq: Once | INTRAVENOUS | Status: DC
  Filled 2018-06-06: qty 1000

## 2018-06-06 MED ORDER — PHENYLEPHRINE 40 MCG/ML (10ML) SYRINGE FOR IV PUSH (FOR BLOOD PRESSURE SUPPORT): 80.0000 ug | PREFILLED_SYRINGE | INTRAVENOUS | Status: DC | PRN

## 2018-06-06 MED ORDER — LACTATED RINGERS IV SOLN: 500.0000 mL | INTRAVENOUS | Status: DC | PRN

## 2018-06-06 MED ORDER — WITCH HAZEL-GLYCERIN EX PADS: 1.0000 "application " | MEDICATED_PAD | CUTANEOUS | Status: DC | PRN

## 2018-06-06 MED ORDER — ONDANSETRON HCL 4 MG/2ML IJ SOLN: 4.0000 mg | INTRAMUSCULAR | Status: DC | PRN

## 2018-06-06 MED ORDER — FENTANYL 2.5 MCG/ML BUPIVACAINE 1/10 % EPIDURAL INFUSION (WH - ANES)
14.0000 mL/h | INTRAMUSCULAR | Status: DC | PRN
  Administered 2018-06-06: 14 mL/h via EPIDURAL
  Filled 2018-06-06: qty 100

## 2018-06-06 MED ORDER — OXYTOCIN 40 UNITS IN LACTATED RINGERS INFUSION - SIMPLE MED
2.5000 [IU]/h | INTRAVENOUS | Status: DC
  Filled 2018-06-06: qty 1000

## 2018-06-06 MED ORDER — DIBUCAINE 1 % RE OINT: 1.0000 "application " | TOPICAL_OINTMENT | RECTAL | Status: DC | PRN

## 2018-06-06 MED ORDER — OXYCODONE-ACETAMINOPHEN 5-325 MG PO TABS: 2.0000 | ORAL_TABLET | ORAL | Status: DC | PRN

## 2018-06-06 MED ORDER — ONDANSETRON HCL 4 MG/2ML IJ SOLN: 4.0000 mg | Freq: Four times a day (QID) | INTRAMUSCULAR | Status: DC | PRN

## 2018-06-06 NOTE — MAU Note (Signed)
Bleeding since this Am, notice it when she wipes  Having some pelvic pressure  Unsure about LOF  +FM

## 2018-06-06 NOTE — Lactation Note (Signed)
This note was copied from a baby's chart. Lactation Consultation Note  Patient Name: Allison Cameron [id number] Date: 06/06/2018   Initial visit attempted at 4 hours of life, but Mom has multiple visitors in room. DEBP pump, kit & cleaning supplies, NICU booklet, & yellow colostrum stickers were brought into room. Mom has our # to call when ready for Lactation to return to discuss expressing her milk.    Matthias Hughs [facility] 06/06/2018, 10:24 PM

## 2018-06-06 NOTE — H&P (Signed)
Allison Cameron is a 32 y.o. female presenting for preterm uterine contractions and leaking of fluid since the morning.    Patient Active Problem List   Diagnosis Date Noted  . Preterm premature rupture of membranes (PPROM) with unknown onset of labor 06/06/2018  . Pregnancy with uncertain dates, first trimester   . Nuchal translucency of fetus on prenatal ultrasound    RN Note: Bleeding since this Am, notice it when she wipes Having some pelvic pressure Unsure about LOF +FM  OB History    Gravida  3   Para  1   Term  1   Preterm      AB  1   Living  1     SAB  1   TAB      Ectopic      Multiple      Live Births             Past Medical History:  Diagnosis Date  . Febrile seizure (HCC)   . Molar pregnancy 2011  . Thyroid disease   . Varicose veins of both lower extremities    Past Surgical History:  Procedure Laterality Date  . DILATION AND EVACUATION  2011   Family History: family history includes Stroke in her brother and father. Social History:  reports that she has never smoked. She has never used smokeless tobacco. She reports that she does not drink alcohol or use drugs.     Maternal Diabetes: No Genetic Screening: Normal Maternal Ultrasounds/Referrals: Normal Fetal Ultrasounds or other Referrals:  None Maternal Substance Abuse:  No Significant Maternal Medications:  None Significant Maternal Lab Results:  Lab values include: Other:  Other Comments:  PPROM, PTL, Treated with antibiotics and Betamethasone  Review of Systems  Constitutional: Negative for chills, fever and malaise/fatigue.  Respiratory: Negative for shortness of breath.   Cardiovascular: Negative for leg swelling.  Gastrointestinal: Positive for abdominal pain. Negative for constipation, diarrhea, nausea and vomiting.   Maternal Medical History:  Reason for admission: Rupture of membranes and contractions.  Nausea.  Contractions: Onset was 1-2 hours ago.   Frequency:  irregular.   Perceived severity is moderate.    Fetal activity: Perceived fetal activity is normal.   Last perceived fetal movement was within the past hour.    Prenatal complications: Preterm labor.   No bleeding, PIH or pre-eclampsia.   Prenatal Complications - Diabetes: none.    Dilation: 4.5 Effacement (%): 90 Station: -1, 0 Exam by:: Mayford Knife, CNM Blood pressure (!) 144/93, pulse 98, temperature 98.2 F (36.8 C), temperature source Oral, resp. rate 16, weight 114.8 kg, last menstrual period 10/16/2017, SpO2 97 %. Maternal Exam:  Uterine Assessment: Contraction strength is moderate.  Contraction frequency is irregular.   Abdomen: Patient reports no abdominal tenderness. Fetal presentation: vertex  Introitus: Normal vulva. Normal vagina.  Ferning test: positive.  Nitrazine test: not done. Amniotic fluid character: not assessed.  Pelvis: adequate for delivery.   Cervix: Cervix evaluated by digital exam.     Fetal Exam Fetal Monitor Review: Mode: ultrasound.   Baseline rate: 140.  Variability: moderate (6-25 bpm).   Pattern: accelerations present and no decelerations.    Fetal State Assessment: Category I - tracings are normal.     Physical Exam  Constitutional: She is oriented to person, place, and time. She appears well-developed and well-nourished. No distress.  HENT:  Head: Normocephalic.  Cardiovascular: Normal rate and regular rhythm.  Respiratory: Effort normal. No respiratory distress. She has no wheezes.  She has no rales.  GI: Soft. She exhibits no distension. There is no tenderness. There is no rebound and no guarding.  Genitourinary:  Genitourinary Comments: Cervix 4/80/-2  Musculoskeletal: Normal range of motion.  Neurological: She is alert and oriented to person, place, and time.  Skin: Skin is warm and dry.  Psychiatric: She has a normal mood and affect.    Prenatal labs: ABO, Rh: --/--/B POS (10/06 1520) Antibody: NEG (10/06 1520) Rubella:    RPR:    HBsAg:    HIV:    GBS:     Assessment/Plan: Single intrauterine pregnancy at [redacted]w[redacted]d Preterm Premature Rupture of Membranes Preterm labor  Admit to [facility] suites Routine orders Latency antibiotics Betamethasone Anticipate SVD   Wynelle Bourgeois 06/06/2018, 5:55 PM

## 2018-06-06 NOTE — MAU Provider Note (Addendum)
History     CSN: [id number]  Arrival date and time: 06/06/18 1300   First Provider Initiated Contact with Patient 06/06/18 1442      Chief Complaint  Patient presents with  . Vaginal Bleeding  . Abdominal Pain   Allison Cameron is a 32 y.o. G3P1011 at [redacted]w[redacted]d who presents today with vaginal bleeding and contractions since about 0800 today. She denies any gush of fluid, but has had some spotting. She has had intercourse in the last 24 hours.   Pelvic Pain  The patient's primary symptoms include pelvic pain, vaginal bleeding and vaginal discharge. This is a new problem. The current episode started today. The problem occurs intermittently. The problem has been unchanged. The pain is moderate. The problem affects both sides. She is pregnant. Pertinent negatives include no chills, dysuria, fever, frequency, nausea or vomiting. The vaginal discharge was bloody. The vaginal bleeding is spotting. She has not been passing clots. She has not been passing tissue. Nothing aggravates the symptoms. She has tried nothing for the symptoms. Sexual activity: reports intercourse in the last 24 hours.     OB History    Gravida  3   Para  1   Term  1   Preterm      AB  1   Living  1     SAB  1   TAB      Ectopic      Multiple      Live Births              Past Medical History:  Diagnosis Date  . Febrile seizure (HCC)   . Molar pregnancy 2011  . Thyroid disease   . Varicose veins of both lower extremities     Past Surgical History:  Procedure Laterality Date  . DILATION AND EVACUATION  2011    Family History  Problem Relation Age of Onset  . Stroke Father   . Stroke Brother     Social History   Tobacco Use  . Smoking status: Never Smoker  . Smokeless tobacco: Never Used  Substance Use Topics  . Alcohol use: Never    Frequency: Never  . Drug use: Never    Allergies: No Known Allergies  Medications Prior to Admission  Medication Sig Dispense Refill Last Dose   . Prenatal Vit-Fe Fumarate-FA (PRENATAL VITAMIN PO) Take by mouth.   Taking    Review of Systems  Constitutional: Negative for chills and fever.  Gastrointestinal: Negative for nausea and vomiting.  Genitourinary: Positive for pelvic pain, vaginal bleeding and vaginal discharge. Negative for dysuria and frequency.   Physical Exam   Blood pressure 129/85, pulse (!) 103, temperature 97.8 F (36.6 C), temperature source Oral, resp. rate 18, weight 114.8 kg, last menstrual period 10/16/2017.  Physical Exam  Nursing note and vitals reviewed. Constitutional: She is oriented to person, place, and time. She appears well-nourished. No distress.  HENT:  Head: Normocephalic.  Cardiovascular: Normal rate.  Respiratory: Effort normal.  Genitourinary:  Genitourinary Comments:  External: no lesion, copious fluid on external genitalia  Vagina: + pooling  Cervix: 4/80/-2 Uterus: AGA  Neurological: She is alert and oriented to person, place, and time.  Skin: Skin is warm and dry.  Psychiatric: She has a normal mood and affect.   FERN: positive   NST:  Baseline: 140 Variability: moderate Accels: 15x15 Decels: none Toco: q   Results for orders placed or performed during the hospital encounter of 06/06/18 (from the past  24 hour(s))  Urinalysis, Routine w reflex microscopic     Status: Abnormal   Collection Time: 06/06/18  1:44 PM  Result Value Ref Range   Color, Urine YELLOW YELLOW   APPearance CLEAR CLEAR   Specific Gravity, Urine 1.016 1.005 - 1.030   pH 6.0 5.0 - 8.0   Glucose, UA NEGATIVE NEGATIVE mg/dL   Hgb urine dipstick NEGATIVE NEGATIVE   Bilirubin Urine NEGATIVE NEGATIVE   Ketones, ur 20 (A) NEGATIVE mg/dL   Protein, ur NEGATIVE NEGATIVE mg/dL   Nitrite NEGATIVE NEGATIVE   Leukocytes, UA NEGATIVE NEGATIVE  Amnisure rupture of membrane (rom)not at [facility]     Status: None   Collection Time: 06/06/18  3:01 PM  Result Value Ref Range   Amnisure ROM POSITIVE     MAU  Course  Procedures  MDM DW Dr. Erin Fulling, patient needs to be admitted at this time as she is in active labor and unstable for transport at this time. If contractions space then we can consider transport.  3:15 PM Infomed NICU attending that patient is not stable for transfer at this time. Plan to admit to labor and delivery. If possible may consider transport later. Patient is aware that baby will have to be transferred if born and there is still no room in NICU.   Assessment and Plan   1. Preterm premature rupture of membranes (PPROM) with unknown onset of labor   2. [redacted] weeks gestation of pregnancy    Admit to labor and delivery  BMZ q 24 if possible  Amp and Azithro for latency Labor team notified   Thressa Sheller 06/06/2018, 2:53 PM

## 2018-06-06 NOTE — Anesthesia Procedure Notes (Signed)
Epidural Patient location during procedure: OB Start time: 06/06/2018 4:57 PM End time: 06/06/2018 5:15 PM  Staffing Anesthesiologist: Trevor Iha, MD Performed: anesthesiologist   Preanesthetic Checklist Completed: patient identified, site marked, surgical consent, pre-op evaluation, timeout performed, IV checked, risks and benefits discussed and monitors and equipment checked  Epidural Patient position: sitting Prep: site prepped and draped and DuraPrep Patient monitoring: continuous pulse ox and blood pressure Approach: midline Location: L2-L3 Injection technique: LOR air  Needle:  Needle type: Tuohy  Needle gauge: 17 G Needle length: 9 cm and 9 Needle insertion depth: 9 cm Catheter type: closed end flexible Catheter size: 19 Gauge Catheter at skin depth: 15 cm Test dose: negative  Assessment Events: blood not aspirated, injection not painful, no injection resistance, negative IV test and no paresthesia  Additional Notes 1 attempt. Pt tolerated procedure well.

## 2018-06-06 NOTE — Anesthesia Preprocedure Evaluation (Signed)
Anesthesia Evaluation  Patient identified by MRN, date of birth, ID band Patient awake    Reviewed: Allergy & Precautions, H&P , NPO status , Patient's Chart, lab work & pertinent test results  Airway Mallampati: II  TM Distance: >3 FB Neck ROM: Full    Dental no notable dental hx. (+) Teeth Intact   Pulmonary neg pulmonary ROS,    Pulmonary exam normal breath sounds clear to auscultation       Cardiovascular negative cardio ROS Normal cardiovascular exam Rhythm:Regular Rate:Normal     Neuro/Psych Seizures -,  negative psych ROS   GI/Hepatic   Endo/Other    Renal/GU      Musculoskeletal   Abdominal (+) + obese,   Peds  Hematology negative hematology ROS (+)   Anesthesia Other Findings   Reproductive/Obstetrics (+) Pregnancy                             Lab Results  Component Value Date   WBC 6.7 06/06/2018   HGB 12.8 06/06/2018   HCT 37.5 06/06/2018   MCV 87.6 06/06/2018   PLT 322 06/06/2018    Anesthesia Physical Anesthesia Plan  ASA: III  Anesthesia Plan: Epidural   Post-op Pain Management:    Induction:   PONV Risk Score and Plan:   Airway Management Planned: Nasal Cannula and Natural Airway  Additional Equipment:   Intra-op Plan:   Post-operative Plan:   Informed Consent: I have reviewed the patients History and Physical, chart, labs and discussed the procedure including the risks, benefits and alternatives for the proposed anesthesia with the patient or authorized representative who has indicated his/her understanding and acceptance.     Plan Discussed with:   Anesthesia Plan Comments:         Anesthesia Quick Evaluation

## 2018-06-07 LAB — GC/CHLAMYDIA PROBE AMP (~~LOC~~) NOT AT ARMC
Chlamydia: NEGATIVE
NEISSERIA GONORRHEA: NEGATIVE

## 2018-06-07 LAB — RPR: RPR: NONREACTIVE

## 2018-06-07 MED ORDER — INFLUENZA VAC SPLIT QUAD 0.5 ML IM SUSY: 0.5000 mL | PREFILLED_SYRINGE | INTRAMUSCULAR | Status: DC

## 2018-06-07 NOTE — Lactation Note (Addendum)
This note was copied from a baby's chart. Lactation Consultation Note [name]. Mom 6 hrs post delivery.  Mom shown how to use DEBP & how to disassemble, clean, & reassemble parts. Mom knows to pump q3h for 15-20 min. Stickers given w/colostrum containers. Instructed to ask for bottle when volume increases. Mom has large breast w/everted nipples. Hand expression demonstrated w/mom teach back demonstration. No colostrum noted at this time. Discussed breast massage, STS, milk storage, supply and demand. NICU booklet given. Mom had a lot of company. Asked visitors to leave for pumping.  Mom has WIC. Information sheet filled out for [facility] pump and faxed. Mom BF her older child for 2 weeks. Mom doesn't appear at this time very excited about pumping. Mom knows its best for the baby. WH/LC brochure given w/resources, support groups and LC services.  Patient Name: Allison Cameron [id number] Date: 06/07/2018 Reason for consult: Initial assessment;NICU baby;Preterm <34wks   Maternal Data Has patient been taught Hand Expression?: Yes Does the patient have breastfeeding experience prior to this delivery?: Yes  Feeding    LATCH Score       Type of Nipple: Everted at rest and after stimulation  Comfort (Breast/Nipple): Soft / non-tender        Interventions Interventions: DEBP;Hand express;Breast massage;Breast compression  Lactation Tools Discussed/Used Tools: Pump Breast pump type: Double-Electric Breast Pump WIC Program: Yes Pump Review: Setup, frequency, and cleaning;Milk Storage Initiated by:: Peri Jefferson RN IBCLC Date initiated:: 06/07/18   Consult Status Consult Status: Follow-up Date: 06/09/18 Follow-up type: In-patient    Charyl Dancer 06/07/2018, 12:32 AM

## 2018-06-07 NOTE — Progress Notes (Signed)
POSTPARTUM PROGRESS NOTE  Post Partum Day 1 Subjective:  Allison Cameron is a 32 y.o. [id number] [redacted]w[redacted]d s/p SVD after PPROM/PL.  No acute events overnight.  Pt denies problems with ambulating, voiding or po intake.  She denies nausea or vomiting.  Pain is well controlled.  She has not had flatus. She has not had bowel movement.  Lochia Small.   Objective: Blood pressure 125/84, pulse 69, temperature 98.3 F (36.8 C), temperature source Oral, resp. rate 16, weight 114.8 kg, last menstrual period 10/16/2017, SpO2 100 %, unknown if currently breastfeeding.  Physical Exam:  General: alert, cooperative and no distress Lochia:normal flow Chest: CTAB Heart: RRR no m/r/g Abdomen: +BS, soft, nontender,  Uterine Fundus: firm DVT Evaluation: No calf swelling or tenderness Extremities: no LE edema b/l  Recent Labs    06/06/18 1520  HGB 12.8  HCT 37.5    Assessment/Plan:  ASSESSMENT: Allison Cameron is a 32 y.o. [id number] [redacted]w[redacted]d s/p SVD of a baby boy after PPROM/PTL  #Prefers Breastfeeding exclusively (pumping) #Contraception: 45yr-Mirena  #Circ: Yes, interested in inpatient circ, would like to discuss further  #Discharge on PPD#2 or #3    LOS: 1 day   Basilio Cairo, Medical Student 06/07/2018, 7:22 AM

## 2018-06-07 NOTE — Progress Notes (Signed)
Post Partum Day 1 Subjective: no complaints, up ad lib and voiding  Objective: Blood pressure 131/77, pulse 81, temperature 98.6 F (37 C), temperature source Oral, resp. rate 16, weight 114.8 kg, last menstrual period 10/16/2017, SpO2 100 %, unknown if currently breastfeeding.  Physical Exam:  General: alert, cooperative and no distress Lochia: appropriate Uterine Fundus: firm Incision: healing well DVT Evaluation: No evidence of DVT seen on physical exam.  Recent Labs    06/06/18 1520  HGB 12.8  HCT 37.5    Assessment/Plan: Plan for discharge tomorrow and Breastfeeding   LOS: 1 day   Wynelle Bourgeois 06/07/2018, 4:08 PM

## 2018-06-07 NOTE — Anesthesia Postprocedure Evaluation (Signed)
Anesthesia Post Note  Patient: Allison Cameron  Procedure(s) Performed: AN AD HOC LABOR EPIDURAL     Patient location during evaluation: Mother Baby Anesthesia Type: Epidural Level of consciousness: awake, awake and alert, oriented and patient cooperative Pain management: pain level not controlled (pt received epidural at 1709, delivery time 1737 - pt states "delivery happened so quickly that the epidural didnt have time to set up ") Vital Signs Assessment: post-procedure vital signs reviewed and stable Respiratory status: spontaneous breathing Cardiovascular status: blood pressure returned to baseline and stable Postop Assessment: no headache, no backache, able to ambulate and patient able to bend at knees Anesthetic complications: no    Last Vitals:  Vitals:   06/07/18 0200 06/07/18 0538  BP: 129/75 125/84  Pulse: 80 69  Resp: 16 16  Temp: 36.7 C 36.8 C  SpO2: 100% 100%    Last Pain:  Vitals:   06/07/18 0538  TempSrc: Oral  PainSc:    Pain Goal:                 Jennelle Human

## 2018-06-08 MED ORDER — IBUPROFEN 600 MG PO TABS: 600.0000 mg | ORAL_TABLET | Freq: Four times a day (QID) | ORAL | 2 refills | Status: DC | PRN

## 2018-06-08 NOTE — Discharge Instructions (Signed)
Vaginal Delivery, Care After °Refer to this sheet in the next few weeks. These instructions provide you with information about caring for yourself after vaginal delivery. Your health care provider may also give you more specific instructions. Your treatment has been planned according to current medical practices, but problems sometimes occur. Call your health care provider if you have any problems or questions. °What can I expect after the procedure? °After vaginal delivery, it is common to have: °· Some bleeding from your vagina. °· Soreness in your abdomen, your vagina, and the area of skin between your vaginal opening and your anus (perineum). °· Pelvic cramps. °· Fatigue. ° °Follow these instructions at home: °Medicines °· Take over-the-counter and prescription medicines only as told by your health care provider. °· If you were prescribed an antibiotic medicine, take it as told by your health care provider. Do not stop taking the antibiotic until it is finished. °Driving ° °· Do not drive or operate heavy machinery while taking prescription pain medicine. °· Do not drive for 24 hours if you received a sedative. °Lifestyle °· Do not drink alcohol. This is especially important if you are breastfeeding or taking medicine to relieve pain. °· Do not use tobacco products, including cigarettes, chewing tobacco, or e-cigarettes. If you need help quitting, ask your health care provider. °Eating and drinking °· Drink at least 8 eight-ounce glasses of water every day unless you are told not to by your health care provider. If you choose to breastfeed your baby, you may need to drink more water than this. °· Eat high-fiber foods every day. These foods may help prevent or relieve constipation. High-fiber foods include: °? Whole grain cereals and breads. °? Brown rice. °? Beans. °? Fresh fruits and vegetables. °Activity °· Return to your normal activities as told by your health care provider. Ask your health care provider  what activities are safe for you. °· Rest as much as possible. Try to rest or take a nap when your baby is sleeping. °· Do not lift anything that is heavier than your baby or 10 lb (4.5 kg) until your health care provider says that it is safe. °· Talk with your health care provider about when you can engage in sexual activity. This may depend on your: °? Risk of infection. °? Rate of healing. °? Comfort and desire to engage in sexual activity. °Vaginal Care °· If you have an episiotomy or a vaginal tear, check the area every day for signs of infection. Check for: °? More redness, swelling, or pain. °? More fluid or blood. °? Warmth. °? Pus or a bad smell. °· Do not use tampons or douches until your health care provider says this is safe. °· Watch for any blood clots that may pass from your vagina. These may look like clumps of dark red, brown, or black discharge. °General instructions °· Keep your perineum clean and dry as told by your health care provider. °· Wear loose, comfortable clothing. °· Wipe from front to back when you use the toilet. °· Ask your health care provider if you can shower or take a bath. If you had an episiotomy or a perineal tear during labor and delivery, your health care provider may tell you not to take baths for a certain length of time. °· Wear a bra that supports your breasts and fits you well. °· If possible, have someone help you with household activities and help care for your baby for at least a few days after   you leave the hospital. °· Keep all follow-up visits for you and your baby as told by your health care provider. This is important. °Contact a health care provider if: °· You have: °? Vaginal discharge that has a bad smell. °? Difficulty urinating. °? Pain when urinating. °? A sudden increase or decrease in the frequency of your bowel movements. °? More redness, swelling, or pain around your episiotomy or vaginal tear. °? More fluid or blood coming from your episiotomy or  vaginal tear. °? Pus or a bad smell coming from your episiotomy or vaginal tear. °? A fever. °? A rash. °? Little or no interest in activities you used to enjoy. °? Questions about caring for yourself or your baby. °· Your episiotomy or vaginal tear feels warm to the touch. °· Your episiotomy or vaginal tear is separating or does not appear to be healing. °· Your breasts are painful, hard, or turn red. °· You feel unusually sad or worried. °· You feel nauseous or you vomit. °· You pass large blood clots from your vagina. If you pass a blood clot from your vagina, save it to show to your health care provider. Do not flush blood clots down the toilet without having your health care provider look at them. °· You urinate more than usual. °· You are dizzy or light-headed. °· You have not breastfed at all and you have not had a menstrual period for 12 weeks after delivery. °· You have stopped breastfeeding and you have not had a menstrual period for 12 weeks after you stopped breastfeeding. °Get help right away if: °· You have: °? Pain that does not go away or does not get better with medicine. °? Chest pain. °? Difficulty breathing. °? Blurred vision or spots in your vision. °? Thoughts about hurting yourself or your baby. °· You develop pain in your abdomen or in one of your legs. °· You develop a severe headache. °· You faint. °· You bleed from your vagina so much that you fill two sanitary pads in one hour. °This information is not intended to replace advice given to you by your health care provider. Make sure you discuss any questions you have with your health care provider. °Document Released: 08/15/2000 Document Revised: 01/30/2016 Document Reviewed: 09/02/2015 °[employer]vier Interactive Patient Education © 2018 [employer] ° °

## 2018-06-08 NOTE — Progress Notes (Signed)
Attending Circumcision Counseling Progress Note  Patient desires circumcision for her female infant who is currently in the NICU.  Circumcision procedure details discussed, risks and benefits of procedure were also discussed.  These include but are not limited to: Benefits of circumcision in men include reduction in the rates of urinary tract infection (UTI), penile cancer, some sexually transmitted infections, penile inflammatory and retractile disorders, as well as easier hygiene.  Risks include bleeding , infection, injury of glans which may lead to penile deformity or urinary tract issues, unsatisfactory cosmetic appearance and other potential complications related to the procedure.  It was emphasized that this is an elective procedure.  Patient wants to proceed with circumcision; written informed consent obtained.  Will do circumcision when the infant is ready to go from NICU as long as payment plans are made, consent was signed today and will be placed in infant's chart (may need to be re-signed if procedure is done >30 days from today).   Jaynie Collins, MD, FACOG Obstetrician & Gynecologist, [facility] for [employer], [facility]

## 2018-06-08 NOTE — Discharge Summary (Signed)
OB Discharge Summary     Patient Name: Allison Cameron DOB: 07/17/1986 MRN: [id number]  Date of admission: 06/06/2018 Delivering MD: Aviva Signs   Date of discharge: 06/08/2018  Admitting diagnosis: 34 WKS BLEEDING PRESSURE Intrauterine pregnancy: [redacted]w[redacted]d     Secondary diagnosis:  Principal Problem:   Preterm premature rupture of membranes (PPROM) delivered, current hospitalization  Additional problems: Preterm delivery     Discharge diagnosis: Preterm Pregnancy Delivered                                                                                                Post partum procedures:None  Augmentation: None  Complications: None  Hospital course:  Onset of Labor With Vaginal Delivery     32 y.o. yo [id number] at [redacted]w[redacted]d was admitted in Active Preterm Labor on 06/06/2018. Patient had an uncomplicated labor course as follows:  Membrane Rupture Time/Date: 8:00 AM ,06/06/2018   Intrapartum Procedures: Episiotomy:                                           Lacerations:  Labial [10];1st degree [2]  Patient had a delivery of a Viable infant. 06/06/2018  Information for the patient's newborn:  Lenell, Mcconnell [[id number]]       Pateint had an uncomplicated postpartum course.  She is ambulating, tolerating a regular diet, passing flatus, and urinating well. Patient is discharged home in stable condition on 06/08/18.   Physical exam  Vitals:   06/07/18 0900 06/07/18 1436 06/07/18 2113 06/08/18 0635  BP: 116/77 131/77 128/89 (!) 130/93  Pulse: (!) 56 81 76 87  Resp: 18 16 18    Temp: 97.9 F (36.6 C) 98.6 F (37 C) 98.3 F (36.8 C)   TempSrc: Oral Oral Oral   SpO2:  100%  99%  Weight:       General: alert, cooperative and no distress Lochia: appropriate Uterine Fundus: firm DVT Evaluation: No evidence of DVT seen on physical exam. Negative Homan's sign. No cords or calf tenderness. No significant calf/ankle edema. Labs: Lab Results  Component Value Date   WBC 6.7  06/06/2018   HGB 12.8 06/06/2018   HCT 37.5 06/06/2018   MCV 87.6 06/06/2018   PLT 322 06/06/2018   CMP Latest Ref Rng & Units 09/21/2009  Glucose 70 - 99 mg/dL 130(Q)  BUN 6 - 23 mg/dL <6(V)  Creatinine 0.4 - 1.2 mg/dL 0.7  Sodium 784 - 696 mEq/L 136  Potassium 3.5 - 5.1 mEq/L 3.4(L)  Chloride 96 - 112 mEq/L 106    Discharge instruction: per After Visit Summary and "Baby and Me Booklet".  After visit meds:  Allergies as of 06/08/2018   No Known Allergies     Medication List    TAKE these medications   ibuprofen 600 MG tablet Commonly known as:  ADVIL,MOTRIN Take 1 tablet (600 mg total) by mouth every 6 (six) hours as needed for moderate pain or cramping.   PRENATAL VITAMIN PO Take 1 tablet by  mouth daily.       Diet: routine diet  Activity: Advance as tolerated. Pelvic rest for 6 weeks.   Outpatient follow up:6 weeks  Postpartum contraception: IUD  Newborn Data: Live born female  Birth Weight: 4 lb 15 oz (2240 g) APGAR: 9, 9  Newborn Delivery   Birth date/time:  06/06/2018 17:37:00 Delivery type:  Vaginal, Spontaneous     Baby Feeding: Breast Disposition:NICU   06/08/2018 Jaynie Collins, MD

## 2018-06-08 NOTE — Lactation Note (Signed)
This note was copied from a baby's chart. Lactation Consultation Note  Patient Name: Boy Ivianna Notch [id number] Date: 06/08/2018 Reason for consult: Follow-up assessment;Preterm <34wks  Mom reports not getting anything with pumping yet.  Not doing massage or hand expression. Demo massage and hand expression with mom.  Mom able to get small drops of colostrum with hand expression. Mom has [employer] Medela Double electric breastpump for home use. Reviewed breastmilk storage and bringing milk back to hospital.  Mom to call as needed. Maternal Data    Feeding Feeding Type: Formula  LATCH Score                   Interventions    Lactation Tools Discussed/Used     Consult Status Consult Status: Complete Date: 06/08/18 Follow-up type: Call as needed    [facility] 06/08/2018, 11:06 AM

## 2018-06-10 ENCOUNTER — Ambulatory Visit (HOSPITAL_COMMUNITY): Payer: BLUE CROSS/BLUE SHIELD

## 2018-06-10 ENCOUNTER — Encounter: Payer: Self-pay | Admitting: Advanced Practice Midwife

## 2018-06-10 DIAGNOSIS — O43819 Placental infarction, unspecified trimester: Secondary | ICD-10-CM | POA: Insufficient documentation

## 2018-06-10 HISTORY — DX: Placental infarction, unspecified trimester: O43.819

## 2018-06-18 ENCOUNTER — Other Ambulatory Visit (HOSPITAL_COMMUNITY): Payer: BLUE CROSS/BLUE SHIELD

## 2018-06-25 ENCOUNTER — Other Ambulatory Visit (HOSPITAL_COMMUNITY): Payer: BLUE CROSS/BLUE SHIELD

## 2018-07-08 ENCOUNTER — Ambulatory Visit (HOSPITAL_COMMUNITY): Payer: BLUE CROSS/BLUE SHIELD

## 2018-09-01 NOTE — L&D Delivery Note (Addendum)
OB/GYN Faculty Practice Delivery Note  Allison Cameron is a 33 y.o. [id number] s/p uncomplicated SVD at [redacted]w[redacted]d. She was admitted for PPROM on 04/04/2019.   ROM: Reportedly 04/03/2019 at home, +amnisure 8/3 upon presentation GBS Status: not collected given GA Maximum Maternal Temperature: 98.7  Labor Progress: . Patient was admitted to [facility] specialty care on 04/04/2019 for PPROM and given BMZ x1 and initiated on amp/azithro. She was then found to be complete tonight around 0200 and was contracting regularly. She was transferred from [facility] specialty care to L&D at this time and spontaneously delivered as noted below.  Delivery Date/Time: 04/05/2019 @0201  Delivery: Called to room and patient was complete and pushing. Head delivered direct OA. No nuchal cord present. Shoulder and body delivered in usual fashion. Infant with spontaneous cry, placed on mother's abdomen, dried and stimulated. Cord clamped x 2 after 1-minute delay, and cut by FOB. Cord blood drawn. Cord avulsion occurred during gentle downward traction and cord was only minimally attached to placenta. Patient was placed in high fowlers position and pushed to spontaneously deliver. Fundus firm with massage. Labia, perineum, vagina, and cervix inspected inspected with no lacerations noted. IM pitocin administered given patient did not have IV access.   Placenta: spontaneous, intact, 3v cord Complications: none Lacerations: none EBL: 100cc Analgesia: none  Postpartum Planning [x]  message to sent to schedule follow-up  [x]  vaccines UTD [ ]  postpartum BTL, papers signed  Infant: vigorous female  APGARs 7/9  weight pending  Allison Cameron, PGY-III Family Medicine  I was gloved and present for entire delivery. SVD without incident. No difficulty with shoulders No lacerations  Patient reports to CNM that she ate a cookie one hour before delivery. She continues to desire BTL. Scheduled for [place], [place] 04/05/19 2:50  AM

## 2018-09-29 DIAGNOSIS — Z3200 Encounter for pregnancy test, result unknown: Secondary | ICD-10-CM | POA: Diagnosis not present

## 2018-09-29 DIAGNOSIS — Z3009 Encounter for other general counseling and advice on contraception: Secondary | ICD-10-CM | POA: Diagnosis not present

## 2018-10-21 ENCOUNTER — Encounter: Payer: Self-pay | Admitting: Radiology

## 2018-10-25 ENCOUNTER — Encounter: Payer: Self-pay | Admitting: *Deleted

## 2018-10-25 ENCOUNTER — Encounter: Payer: Self-pay | Admitting: Obstetrics and Gynecology

## 2018-10-25 ENCOUNTER — Ambulatory Visit (INDEPENDENT_AMBULATORY_CARE_PROVIDER_SITE_OTHER): Payer: Medicaid Other | Admitting: Obstetrics and Gynecology

## 2018-10-25 ENCOUNTER — Other Ambulatory Visit (HOSPITAL_COMMUNITY)
Admission: RE | Admit: 2018-10-25 | Discharge: 2018-10-25 | Disposition: A | Discharge status: Home / Self Care | Payer: Medicaid Other | Source: Ambulatory Visit | Attending: Obstetrics and Gynecology | Admitting: Obstetrics and Gynecology

## 2018-10-25 DIAGNOSIS — O283 Abnormal ultrasonic finding on antenatal screening of mother: Secondary | ICD-10-CM | POA: Diagnosis not present

## 2018-10-25 DIAGNOSIS — O0990 Supervision of high risk pregnancy, unspecified, unspecified trimester: Secondary | ICD-10-CM | POA: Diagnosis not present

## 2018-10-25 DIAGNOSIS — O09899 Supervision of other high risk pregnancies, unspecified trimester: Secondary | ICD-10-CM | POA: Insufficient documentation

## 2018-10-25 DIAGNOSIS — O09521 Supervision of elderly multigravida, first trimester: Secondary | ICD-10-CM

## 2018-10-25 DIAGNOSIS — Z3A10 10 weeks gestation of pregnancy: Secondary | ICD-10-CM | POA: Diagnosis not present

## 2018-10-25 DIAGNOSIS — O99211 Obesity complicating pregnancy, first trimester: Secondary | ICD-10-CM

## 2018-10-25 DIAGNOSIS — O09211 Supervision of pregnancy with history of pre-term labor, first trimester: Secondary | ICD-10-CM

## 2018-10-25 DIAGNOSIS — O99210 Obesity complicating pregnancy, unspecified trimester: Secondary | ICD-10-CM | POA: Insufficient documentation

## 2018-10-25 DIAGNOSIS — O09511 Supervision of elderly primigravida, first trimester: Secondary | ICD-10-CM | POA: Diagnosis not present

## 2018-10-25 DIAGNOSIS — O09891 Supervision of other high risk pregnancies, first trimester: Secondary | ICD-10-CM

## 2018-10-25 DIAGNOSIS — O09529 Supervision of elderly multigravida, unspecified trimester: Secondary | ICD-10-CM

## 2018-10-25 DIAGNOSIS — O09219 Supervision of pregnancy with history of pre-term labor, unspecified trimester: Secondary | ICD-10-CM

## 2018-10-25 DIAGNOSIS — O0991 Supervision of high risk pregnancy, unspecified, first trimester: Secondary | ICD-10-CM | POA: Diagnosis not present

## 2018-10-25 DIAGNOSIS — Z8759 Personal history of other complications of pregnancy, childbirth and the puerperium: Secondary | ICD-10-CM

## 2018-10-25 DIAGNOSIS — Z6841 Body Mass Index (BMI) 40.0 and over, adult: Secondary | ICD-10-CM | POA: Insufficient documentation

## 2018-10-25 DIAGNOSIS — O09291 Supervision of pregnancy with other poor reproductive or obstetric history, first trimester: Secondary | ICD-10-CM | POA: Diagnosis not present

## 2018-10-25 HISTORY — DX: Personal history of other complications of pregnancy, childbirth and the puerperium: Z87.59

## 2018-10-25 NOTE — Progress Notes (Signed)
New OB Note  10/25/2018   Clinic: [facility]  Chief Complaint: NOB  Transfer of Care Patient: NOB  History of Present Illness: [name] is a 33 y.o. [id number] @ 10/5 weeks ([tentative] [facility] 9/16, based on Patient's last menstrual period was 08/11/2018.=10wk u/s today).  Preg complicated by has History of preterm delivery, currently pregnant; Supervision of high risk pregnancy, antepartum; History of molar pregnancy; BMI 40.0-44.9, adult (HCC); Obesity in pregnancy; Short interval between pregnancies affecting pregnancy, antepartum; AMA (advanced maternal age) multigravida 35+; and Abnormal antenatal ultrasound on their problem list.   Any events prior to today's visit: no Her periods were: qmonth, regular She was using no method when she conceived.  She has Negative signs or symptoms of miscarriage or preterm labor On any medications around the time she conceived/early pregnancy: No   ROS: A 12-point review of systems was performed and negative, except as stated in the above HPI.  OBGYN History: As per HPI. OB History  Gravida Para Term Preterm AB Living  4 2 1 1 1 2   SAB TAB Ectopic Multiple Live Births  0     0 2    # Outcome Date GA Lbr Len/2nd Weight Sex Delivery Anes PTL Lv  4 Current           3 Preterm 06/06/18 [redacted]w[redacted]d 07:20 / 00:17 4 lb 15 oz (2.24 kg) M Vag-Spont EPI  LIV  2 Molar 2011             Birth Comments: molar pregnancy  1 Term 2008    M Vag-Spont   LIV    Any issues with any prior pregnancies: yes Prior children are healthy, doing well, and without any problems or issues: yes History of pap smears: Yes. Last pap smear 2019 and results were negative   Past Medical History: Past Medical History:  Diagnosis Date  . Febrile seizure (HCC)   . Molar pregnancy 2011  . 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 Thyroid disease   . Varicose veins of both lower extremities     Past Surgical  History: Past Surgical History:  Procedure Laterality Date  . DILATION AND EVACUATION  2011    Family History:  Family History  Problem Relation Age of Onset  . Stroke Father   . Stroke Brother     Social History:  Social History   Socioeconomic History  . Marital status: Single    Spouse name: Not on file  . Number of children: Not on file  . Years of education: Not on file  . Highest education level: Not on file  Occupational History  . Not on file  Social Needs  . Financial resource strain: Not on file  . Food insecurity:    Worry: Not on file    Inability: Not on file  . Transportation needs:    Medical: Not on file    Non-medical: Not on file  Tobacco Use  . Smoking status: Never Smoker  . Smokeless tobacco: Never Used  Substance and Sexual Activity  . Alcohol use: Never    Frequency: Never  . Drug use: Never  . Sexual activity: Yes    Birth control/protection: None  Lifestyle  . Physical activity:    Days per week: Not on file    Minutes per session: Not on file  . Stress: Not on file  Relationships  . Social connections:    Talks on phone:  Not on file    Gets together: Not on file    Attends religious service: Not on file    Active member of club or organization: Not on file    Attends meetings of clubs or organizations: Not on file    Relationship status: Not on file  . Intimate partner violence:    Fear of current or ex partner: Not on file    Emotionally abused: Not on file    Physically abused: Not on file    Forced sexual activity: Not on file  Other Topics Concern  . Not on file  Social History Narrative  . Not on file    Allergy: No Known Allergies  Health Maintenance:  Mammogram Up to Date: not applicable  Current Outpatient Medications: PNV  Physical Exam:   BP 134/81   Pulse (!) 109   Wt 258 lb (117 kg)   LMP 08/11/2018   BMI 43.94 kg/m  Body mass index is 43.94 kg/m. Contractions: Not present Vag. Bleeding:  None. Fundal height: not applicable FHTs: 160s  General appearance: Well nourished, well developed female in no acute distress.  Cardiovascular: S1, S2 normal, no murmur, rub or gallop, regular rate and rhythm Respiratory:  Clear to auscultation bilateral. Normal respiratory effort Abdomen: positive bowel sounds and no masses, hernias; diffusely non tender to palpation, non distended Breasts: patient declines any breast s/s Neuro/Psych:  Normal mood and affect.  Skin:  Warm and dry.   Laboratory: none  Imaging:  Bedside u/s: SLIUP with normal FHR, subj normal AF, +FM, hard to get a good CRL due to fetal positioning but appears to be approximately 11/2 weeks. Adjacent to the fetus in the amniotic sac is a 3-4cm cystic thin to medium wall thickness structure adjacent to fetus on 10/25/18 u/s. No blood flow, black, simple appearing fluid.   Assessment: pt stable, ?unknown intra-amniotic cystic structure  Plan: 1. Supervision of high risk pregnancy, antepartum Routine care. Genetics today - Obstetric Panel, Including HIV - Culture, OB Urine - Protein / creatinine ratio, urine - TSH - Hemoglobin A1c - Comprehensive metabolic panel - Beta hCG quant (ref lab) - [place] MFM Fetal Nuchal Translucency; Future - Cervicovaginal ancillary only( [place])  2. History of molar pregnancy F/u u/s and beta hcg. No chest pain, sob, palps - [place] MFM Fetal Nuchal Translucency; Future  3. BMI 40.0-44.9, adult (HCC) Baseline labs today - Obstetric Panel, Including HIV - Culture, OB Urine - Protein / creatinine ratio, urine - TSH - Hemoglobin A1c - Comprehensive metabolic panel - Beta hCG quant (ref lab) - [place] MFM Fetal Nuchal Translucency; Future  4. Obesity in pregnancy - Obstetric Panel, Including HIV - Culture, OB Urine - Protein / creatinine ratio, urine - TSH - Hemoglobin A1c - Comprehensive metabolic panel - Beta hCG quant (ref lab) - [place] MFM Fetal Nuchal Translucency; Future  5.  History of preterm delivery, currently pregnant D/w her re: 17p nv  6. Short interval between pregnancies affecting pregnancy, antepartum  7. Antepartum multigravida of advanced maternal age Genetics today.  - Obstetric Panel, Including HIV - Culture, OB Urine - Protein / creatinine ratio, urine - TSH - Hemoglobin A1c - Comprehensive metabolic panel - Beta hCG quant (ref lab) - [place] MFM Fetal Nuchal Translucency; Future  8. Abnormal antenatal ultrasound Will set up for mfm nt and u/s to evaluate this  Problem list reviewed and updated.  Follow up in 2 weeks.   >50% of 25 min visit spent on counseling  and coordination of care.     Durene Romans MD Attending Center for [facility])

## 2018-10-25 NOTE — Progress Notes (Signed)
Last pap was last march-normal

## 2018-10-26 LAB — COMPREHENSIVE METABOLIC PANEL
ALT: 18 IU/L (ref 0–32)
AST: 13 IU/L (ref 0–40)
Albumin/Globulin Ratio: 1.7 (ref 1.2–2.2)
Albumin: 4.3 g/dL (ref 3.8–4.8)
Alkaline Phosphatase: 39 IU/L (ref 39–117)
BILIRUBIN TOTAL: 0.2 mg/dL (ref 0.0–1.2)
BUN/Creatinine Ratio: 18 (ref 9–23)
BUN: 10 mg/dL (ref 6–20)
CALCIUM: 10 mg/dL (ref 8.7–10.2)
CO2: 20 mmol/L (ref 20–29)
Chloride: 101 mmol/L (ref 96–106)
Creatinine, Ser: 0.55 mg/dL — ABNORMAL LOW (ref 0.57–1.00)
GFR, EST AFRICAN AMERICAN: 141 mL/min/{1.73_m2} (ref >59)
GFR, EST NON AFRICAN AMERICAN: 122 mL/min/{1.73_m2} (ref >59)
GLUCOSE: 79 mg/dL (ref 65–99)
Globulin, Total: 2.6 g/dL (ref 1.5–4.5)
POTASSIUM: 3.9 mmol/L (ref 3.5–5.2)
SODIUM: 135 mmol/L (ref 134–144)
TOTAL PROTEIN: 6.9 g/dL (ref 6.0–8.5)

## 2018-10-26 LAB — OBSTETRIC PANEL, INCLUDING HIV
ANTIBODY SCREEN: NEGATIVE
BASOS: 1 %
Basophils Absolute: 0 10*3/uL (ref 0.0–0.2)
EOS (ABSOLUTE): 0 10*3/uL (ref 0.0–0.4)
EOS: 0 %
HEMOGLOBIN: 12.4 g/dL (ref 11.1–15.9)
HEP B S AG: NEGATIVE
HIV SCREEN 4TH GENERATION: NONREACTIVE
Hematocrit: 35.7 % (ref 34.0–46.6)
Immature Grans (Abs): 0 10*3/uL (ref 0.0–0.1)
Immature Granulocytes: 1 %
LYMPHS ABS: 1.5 10*3/uL (ref 0.7–3.1)
Lymphs: 30 %
MCH: 29.7 pg (ref 26.6–33.0)
MCHC: 34.7 g/dL (ref 31.5–35.7)
MCV: 86 fL (ref 79–97)
Monocytes Absolute: 0.5 10*3/uL (ref 0.1–0.9)
Monocytes: 10 %
Neutrophils Absolute: 3 10*3/uL (ref 1.4–7.0)
Neutrophils: 58 %
Platelets: 319 10*3/uL (ref 150–450)
RBC: 4.17 x10E6/uL (ref 3.77–5.28)
RDW: 13.6 % (ref 11.7–15.4)
RH TYPE: POSITIVE
RPR: NONREACTIVE
Rubella Antibodies, IGG: 1.22 index (ref >0.99)
WBC: 5.1 10*3/uL (ref 3.4–10.8)

## 2018-10-26 LAB — PROTEIN / CREATININE RATIO, URINE
Creatinine, Urine: 147.9 mg/dL
PROTEIN/CREAT RATIO: 54 mg/g{creat} (ref 0–200)
Protein, Ur: 8 mg/dL

## 2018-10-26 LAB — BETA HCG QUANT (REF LAB): hCG Quant: 52992 m[IU]/mL

## 2018-10-26 LAB — TSH: TSH: 0.917 u[IU]/mL (ref 0.450–4.500)

## 2018-10-26 LAB — HEMOGLOBIN A1C
ESTIMATED AVERAGE GLUCOSE: 108 mg/dL
Hgb A1c MFr Bld: 5.4 % (ref 4.8–5.6)

## 2018-10-27 LAB — URINE CULTURE, OB REFLEX: Organism ID, Bacteria: NO GROWTH

## 2018-10-27 LAB — CULTURE, OB URINE

## 2018-10-27 LAB — CERVICOVAGINAL ANCILLARY ONLY
Chlamydia: NEGATIVE
NEISSERIA GONORRHEA: NEGATIVE
TRICH (WINDOWPATH): NEGATIVE

## 2018-11-08 ENCOUNTER — Ambulatory Visit (HOSPITAL_COMMUNITY): Payer: Medicaid Other | Admitting: *Deleted

## 2018-11-08 ENCOUNTER — Encounter (HOSPITAL_COMMUNITY): Payer: Self-pay

## 2018-11-08 ENCOUNTER — Ambulatory Visit (HOSPITAL_COMMUNITY)
Admission: RE | Admit: 2018-11-08 | Discharge: 2018-11-08 | Disposition: A | Discharge status: Home / Self Care | Payer: Medicaid Other | Source: Ambulatory Visit | Attending: Obstetrics and Gynecology | Admitting: Obstetrics and Gynecology

## 2018-11-08 ENCOUNTER — Other Ambulatory Visit (HOSPITAL_COMMUNITY): Payer: Self-pay | Admitting: *Deleted

## 2018-11-08 ENCOUNTER — Other Ambulatory Visit (HOSPITAL_COMMUNITY): Payer: Medicaid Other

## 2018-11-08 VITALS — BP 132/74 | HR 87 | Wt 262.6 lb

## 2018-11-08 DIAGNOSIS — Z8759 Personal history of other complications of pregnancy, childbirth and the puerperium: Secondary | ICD-10-CM | POA: Diagnosis not present

## 2018-11-08 DIAGNOSIS — O09891 Supervision of other high risk pregnancies, first trimester: Secondary | ICD-10-CM | POA: Diagnosis not present

## 2018-11-08 DIAGNOSIS — Z6841 Body Mass Index (BMI) 40.0 and over, adult: Secondary | ICD-10-CM | POA: Diagnosis not present

## 2018-11-08 DIAGNOSIS — O99211 Obesity complicating pregnancy, first trimester: Secondary | ICD-10-CM

## 2018-11-08 DIAGNOSIS — O09529 Supervision of elderly multigravida, unspecified trimester: Secondary | ICD-10-CM | POA: Insufficient documentation

## 2018-11-08 DIAGNOSIS — O09523 Supervision of elderly multigravida, third trimester: Secondary | ICD-10-CM | POA: Diagnosis not present

## 2018-11-08 DIAGNOSIS — O09291 Supervision of pregnancy with other poor reproductive or obstetric history, first trimester: Secondary | ICD-10-CM | POA: Diagnosis not present

## 2018-11-08 DIAGNOSIS — O289 Unspecified abnormal findings on antenatal screening of mother: Secondary | ICD-10-CM

## 2018-11-08 DIAGNOSIS — Z3A12 12 weeks gestation of pregnancy: Secondary | ICD-10-CM

## 2018-11-08 DIAGNOSIS — Z363 Encounter for antenatal screening for malformations: Secondary | ICD-10-CM | POA: Diagnosis not present

## 2018-11-08 DIAGNOSIS — O0990 Supervision of high risk pregnancy, unspecified, unspecified trimester: Secondary | ICD-10-CM | POA: Insufficient documentation

## 2018-11-08 DIAGNOSIS — O09211 Supervision of pregnancy with history of pre-term labor, first trimester: Secondary | ICD-10-CM

## 2018-11-08 DIAGNOSIS — O09212 Supervision of pregnancy with history of pre-term labor, second trimester: Secondary | ICD-10-CM

## 2018-11-08 DIAGNOSIS — O99210 Obesity complicating pregnancy, unspecified trimester: Secondary | ICD-10-CM

## 2018-11-09 ENCOUNTER — Ambulatory Visit (INDEPENDENT_AMBULATORY_CARE_PROVIDER_SITE_OTHER): Payer: Medicaid Other | Admitting: Obstetrics and Gynecology

## 2018-11-09 VITALS — BP 130/85 | HR 92 | Wt 260.0 lb

## 2018-11-09 DIAGNOSIS — O283 Abnormal ultrasonic finding on antenatal screening of mother: Secondary | ICD-10-CM

## 2018-11-09 DIAGNOSIS — Z3A12 12 weeks gestation of pregnancy: Secondary | ICD-10-CM

## 2018-11-09 DIAGNOSIS — O99211 Obesity complicating pregnancy, first trimester: Secondary | ICD-10-CM

## 2018-11-09 DIAGNOSIS — O09219 Supervision of pregnancy with history of pre-term labor, unspecified trimester: Secondary | ICD-10-CM

## 2018-11-09 DIAGNOSIS — O99210 Obesity complicating pregnancy, unspecified trimester: Secondary | ICD-10-CM

## 2018-11-09 DIAGNOSIS — O09899 Supervision of other high risk pregnancies, unspecified trimester: Secondary | ICD-10-CM

## 2018-11-09 DIAGNOSIS — O0990 Supervision of high risk pregnancy, unspecified, unspecified trimester: Secondary | ICD-10-CM

## 2018-11-09 DIAGNOSIS — O09211 Supervision of pregnancy with history of pre-term labor, first trimester: Secondary | ICD-10-CM

## 2018-11-09 DIAGNOSIS — Z6841 Body Mass Index (BMI) 40.0 and over, adult: Secondary | ICD-10-CM

## 2018-11-09 DIAGNOSIS — Z8759 Personal history of other complications of pregnancy, childbirth and the puerperium: Secondary | ICD-10-CM

## 2018-11-09 DIAGNOSIS — O09521 Supervision of elderly multigravida, first trimester: Secondary | ICD-10-CM

## 2018-11-09 MED ORDER — ASPIRIN EC 81 MG PO TBEC: 81.0000 mg | DELAYED_RELEASE_TABLET | Freq: Every day | ORAL | 3 refills | Status: AC

## 2018-11-09 NOTE — Progress Notes (Signed)
Prenatal Visit Note Date: 11/09/2018 Clinic: [facility]  Subjective:  Allison Cameron is a 33 y.o. [id number] at [redacted]w[redacted]d being seen today for ongoing prenatal care.  She is currently monitored for the following issues for this high-risk pregnancy and has History of preterm delivery, currently pregnant; Supervision of high risk pregnancy, antepartum; History of molar pregnancy; BMI 40.0-44.9, adult (HCC); Obesity in pregnancy; Short interval between pregnancies affecting pregnancy, antepartum; AMA (advanced maternal age) multigravida 35+; and Abnormal antenatal ultrasound on their problem list.  Patient reports no complaints.   Contractions: Not present. Vag. Bleeding: None.   . Denies leaking of fluid.   The following portions of the patient's history were reviewed and updated as appropriate: allergies, current medications, past family history, past medical history, past social history, past surgical history and problem list. Problem list updated.  Objective:   Vitals:   11/09/18 0941  BP: 130/85  Pulse: 92  Weight: 260 lb (117.9 kg)    Fetal Status: Fetal Heart Rate (bpm): 150's         General:  Alert, oriented and cooperative. Patient is in no acute distress.  Skin: Skin is warm and dry. No rash noted.   Cardiovascular: Normal heart rate noted  Respiratory: Normal respiratory effort, no problems with respiration noted  Abdomen: Soft, gravid, appropriate for gestational age. Pain/Pressure: Absent     Pelvic:  Cervical exam deferred        Extremities: Normal range of motion.  Edema: None  Mental Status: Normal mood and affect. Normal behavior. Normal judgment and thought content.   Urinalysis:      Assessment and Plan:  Pregnancy: [id number] at [redacted]w[redacted]d  1. Supervision of high risk pregnancy, antepartum Routine care. Offer afp nv. Pt amenable to starting low dose asa  2. History of preterm delivery, currently pregnant Pt amenable to doing 17p. Ordered today. Start  nv  3. History of molar pregnancy PP quant. Placenta to path  4. Obesity in pregnancy Weight stable  5. BMI 40.0-44.9, adult (HCC)   6. Multigravida of advanced maternal age in first trimester Low risk panorama. F/u anatomy  7. Abnormal antenatal ultrasound Likely blood collection. Low risk NT  Preterm labor symptoms and general obstetric precautions including but not limited to vaginal bleeding, contractions, leaking of fluid and fetal movement were reviewed in detail with the patient. Please refer to After Visit Summary for other counseling recommendations.  Return in about 1 month (around 12/10/2018) for rob.   [place], MD

## 2018-12-06 ENCOUNTER — Telehealth: Payer: Self-pay | Admitting: *Deleted

## 2018-12-06 NOTE — Telephone Encounter (Signed)
Called about patient Allison Cameron, and compounding pharmacy was waiting to hear back from patient. Called patient to have her call the compounding pharmacy so that we could receive her Allison Cameron. Also informed to due weekly BP and to sen to through mychart.

## 2018-12-07 ENCOUNTER — Encounter: Payer: Medicaid Other | Admitting: Obstetrics & Gynecology

## 2018-12-14 ENCOUNTER — Ambulatory Visit (INDEPENDENT_AMBULATORY_CARE_PROVIDER_SITE_OTHER): Payer: Medicaid Other | Admitting: Obstetrics & Gynecology

## 2018-12-14 ENCOUNTER — Other Ambulatory Visit: Payer: Self-pay

## 2018-12-14 VITALS — BP 136/81 | HR 72 | Wt 261.0 lb

## 2018-12-14 DIAGNOSIS — O283 Abnormal ultrasonic finding on antenatal screening of mother: Secondary | ICD-10-CM

## 2018-12-14 DIAGNOSIS — O0990 Supervision of high risk pregnancy, unspecified, unspecified trimester: Secondary | ICD-10-CM | POA: Diagnosis not present

## 2018-12-14 DIAGNOSIS — O09219 Supervision of pregnancy with history of pre-term labor, unspecified trimester: Principal | ICD-10-CM

## 2018-12-14 DIAGNOSIS — O09899 Supervision of other high risk pregnancies, unspecified trimester: Secondary | ICD-10-CM

## 2018-12-14 DIAGNOSIS — O09212 Supervision of pregnancy with history of pre-term labor, second trimester: Secondary | ICD-10-CM

## 2018-12-14 DIAGNOSIS — Z3A17 17 weeks gestation of pregnancy: Secondary | ICD-10-CM

## 2018-12-14 MED ORDER — HYDROXYPROGESTERONE CAPROATE 275 MG/1.1ML ~~LOC~~ SOAJ
275.0000 mg | Freq: Once | SUBCUTANEOUS | Status: AC
  Administered 2018-12-14: 275 mg via SUBCUTANEOUS

## 2018-12-14 NOTE — Progress Notes (Signed)
PRENATAL VISIT NOTE  Subjective:  Allison Cameron is a 33 y.o. [phone] at [redacted]w[redacted]d being seen today for ongoing prenatal care.  She is currently monitored for the following issues for this high-risk pregnancy and has History of preterm delivery, currently pregnant; Supervision of high risk pregnancy, antepartum; History of molar pregnancy; BMI 40.0-44.9, adult (HCC); Obesity in pregnancy; Short interval between pregnancies affecting pregnancy, antepartum; AMA (advanced maternal age) multigravida 35+; and Abnormal finding on antenatal ultrasound on their problem list.  Patient reports no complaints.  Contractions: Not present.  .  Movement: Present. Denies leaking of fluid.   The following portions of the patient's history were reviewed and updated as appropriate: allergies, current medications, past family history, past medical history, past social history, past surgical history and problem list.   Objective:   Vitals:   12/14/18 0953  BP: 136/81  Pulse: 72  Weight: 261 lb (118.4 kg)    Fetal Status: Fetal Heart Rate (bpm): 154   Movement: Present     General:  Alert, oriented and cooperative. Patient is in no acute distress.  Skin: Skin is warm and dry. No rash noted.   Cardiovascular: Normal heart rate noted  Respiratory: Normal respiratory effort, no problems with respiration noted  Abdomen: Soft, gravid, appropriate for gestational age.  Pain/Pressure: Absent     Pelvic: Cervical exam deferred        Extremities: Normal range of motion.  Edema: None  Mental Status: Normal mood and affect. Normal behavior. Normal judgment and thought content.   Imaging: [place] [facility] Fetal Nuchal Translucency  Result Date: 11/08/2018 ----------------------------------------------------------------------  OBSTETRICS REPORT                       (Signed Final 11/08/2018 12:13 pm) ---------------------------------------------------------------------- Patient Info  ID #:       [id number]                           D.O.B.:  07-Jun-1986 (33 yrs)  Name:       Allison Cameron                 Visit Date: 11/08/2018 10:45 am ---------------------------------------------------------------------- Performed By  Performed By:     Marcellina Millin          Ref. Address:     [address]                    [address]                                                             [address]  Attending:        Noralee Space MD        [facility]  Referred By:      Mila Merry  [facility]                    Healthcare ---------------------------------------------------------------------- Orders   #  Description                          Code         Ordered By   1  [place] [facility] FETAL NUCHAL                  [phone]      CHARLIE PICKENS      TRANSLUCENCY  ----------------------------------------------------------------------   #  Order #                    Accession #                 Episode #   1  [id number]                  [id number]                  [id number]  ---------------------------------------------------------------------- Indications   Abnormal fetal ultrasound (cystic like         O28.9   structure seen in office 10/25/18)   [redacted] weeks gestation of pregnancy                Z3A.12   Encounter for antenatal screening for          Z36.3   malformations (low risk NIPS)   Short interval between pregancies, 1st         O09.891   trimester   Obesity complicating pregnancy, first          O99.211   trimester   Poor obstetrical history (molar pregnancy)     O09.299   Poor obstetric history: Previous preterm       O09.219   delivery, antepartum  ---------------------------------------------------------------------- Fetal Evaluation  Num Of Fetuses:         1  Fetal Heart Rate(bpm):  164  Cardiac Activity:       Observed  Presentation:           Variable  Placenta:               Anterior  Amniotic Fluid  AFI FV:      Within  normal limits ---------------------------------------------------------------------- Biometry  CRL:      69.6  mm     G. Age:  13w 0d                  EDD:   05/16/19 ---------------------------------------------------------------------- OB History  Gravidity:    4         Term:   1        Prem:   1        SAB:   0  TOP:          0       Ectopic:  0        Living: 2 ---------------------------------------------------------------------- Gestational Age  LMP:           12w 5d        Date:  08/11/18                 EDD:   05/18/19  Best:  12w 5d     Det. By:  LMP  (08/11/18)          EDD:   05/18/19 ---------------------------------------------------------------------- 1st Trimester Genetic Sonogram Screening  CRL:            69.6  mm    G. Age:   13w 0d                 EDD:   05/16/19  Nuc Trans:       1.5  mm  Nasal Bone:                 Present ---------------------------------------------------------------------- Anatomy  Cranium:               Appears normal         Bladder:                Appears normal  Choroid Plexus:        Appears normal         Upper Extremities:      Appears normal  Stomach:               Appears normal, left   Lower Extremities:      Appears normal                         sided  Abdomen:               Appears normal ---------------------------------------------------------------------- Cervix Uterus Adnexa  Cervix  Closed.  Uterus  No abnormality visualized.  Left Ovary  Within normal limits.  Right Ovary  Within normal limits. Small corpus luteum noted.  Cul De Sac  No free fluid seen.  Adnexa  No abnormality visualized. ---------------------------------------------------------------------- Impression  Allison Cameron, G4 P2, is here for nuchal-translucency  measurement. She had abnormal placental finding on your  office scan. She had 2 previous term vaginal deliveries and a  molar pregnancy (2011). She had 1 term delivery (2008) and  1 preterm delivery (06/2018). She had increased  MSAFP in  that pregnancy. Patient reports she does not want to take  progesterone prophylaxis.  On ultrasound, the CRL measurement is consistent with her  previously-established dates and good fetal heart activity is  seen. The nuchal translucency (NT) measures 1.5  millimeters, which is normal.  Fetal anatomy that could be  ascertained at this gestational age is normal.  A small placental lake (preplacental surface), measuring 3.6 x  1.7 cm, is seen. No molar changes in the placenta are seen.  We reassured the patient of the findings.  On cell-free fetal DNA screening, the risks of fetal  aneuploidies are not increased. I discussed the significance  (superior to nuchal-translucency screening) and limitations of  first-trimester screening. I also informed her that only invasive  testing will give a definitive result on the fetal karyotype.  History of preterm delivery: It is difficult to assess the  contribution of increased MSAFP to preterm delivery. Patient  may benefit from  prophylactic progesterone injections. ---------------------------------------------------------------------- Recommendations  -An appointment was made for her to return in 7 weeks for  fetal anatomy and cervical length measurement. ----------------------------------------------------------------------                  Noralee Spaceavi Shankar, MD Electronically Signed Final Report   11/08/2018 12:13 pm ----------------------------------------------------------------------   Assessment and Plan:  Pregnancy: [id number] at 2071w6d 1. History of preterm delivery, currently pregnant Initiated weekly 17P today. Patient does  not want to administer this to herself, will come in weekly for RN injection visits.  2. Abnormal antenatal ultrasound ?Placental lake seen on scan, will follow up on subsequent scans  3. Supervision of high risk pregnancy, antepartum Low risk NIPS. Anatomy scan scheduled. Enrolled in Babyscripts so she can enter BP and weight weekly  to help with virtual MD visits.  AFP and SMN done today.   - Enroll Patient in Babyscripts - Babyscripts Schedule Optimization - AFP, Serum, Open Spina Bifida - SMN1 Copy Number Analysis No other complaints or concerns.  Routine obstetric precautions reviewed. Please refer to After Visit Summary for other counseling recommendations.   Return in about 4 weeks (around 01/11/2019) for Virtual OB Visit  (needs weekly 17P RN injection visits from now until 36 weeks).  Future Appointments  Date Time Provider Department Center  12/28/2018  9:50 AM [facility] NURSE [facility]  12/28/2018 10:00 AM [facility] [place] 3 [facility]    Jaynie Collins, MD

## 2018-12-14 NOTE — Patient Instructions (Signed)
Return to office for any scheduled appointments. Call the office or go to the MAU at [facility] if:  You begin to have strong, frequent contractions  Your water breaks.  Sometimes it is a big gush of fluid, sometimes it is just a trickle that keeps getting your panties wet or running down your legs  You have vaginal bleeding.  It is normal to have a small amount of spotting if your cervix was checked.   You do not feel your baby moving like normal.  If you do not, get something to eat and drink and lay down and focus on feeling your baby move.   If your baby is still not moving like normal, you should call the office or go to MAU.  Any other obstetric concerns.   

## 2018-12-21 ENCOUNTER — Ambulatory Visit (INDEPENDENT_AMBULATORY_CARE_PROVIDER_SITE_OTHER): Payer: Medicaid Other

## 2018-12-21 ENCOUNTER — Other Ambulatory Visit: Payer: Self-pay

## 2018-12-21 VITALS — BP 115/75 | HR 72

## 2018-12-21 DIAGNOSIS — O09212 Supervision of pregnancy with history of pre-term labor, second trimester: Secondary | ICD-10-CM | POA: Diagnosis not present

## 2018-12-21 DIAGNOSIS — O0990 Supervision of high risk pregnancy, unspecified, unspecified trimester: Secondary | ICD-10-CM

## 2018-12-21 MED ORDER — HYDROXYPROGESTERONE CAPROATE 275 MG/1.1ML ~~LOC~~ SOAJ
275.0000 mg | Freq: Once | SUBCUTANEOUS | Status: AC
  Administered 2018-12-21: 275 mg via SUBCUTANEOUS

## 2018-12-21 NOTE — Progress Notes (Signed)
I have reviewed the chart and agree with nursing staff's documentation of this patient's encounter.  Gerrad Welker, MD 12/21/2018 12:12 PM    

## 2018-12-21 NOTE — Progress Notes (Signed)
Patient presented to the office today for a 17-p injection received in her left right arm sq. 275 mg.  Vitals 115/75  Patient will follow up in a week for next injection.

## 2018-12-24 LAB — SMN1 COPY NUMBER ANALYSIS (SMA CARRIER SCREENING)

## 2018-12-24 LAB — AFP, SERUM, OPEN SPINA BIFIDA
AFP MoM: 1.11
AFP Value: 37.7 ng/mL
Gest. Age on Collection Date: 17.9 weeks
Maternal Age At EDD: 35.6 yr
OSBR Risk 1 IN: 10000
Test Results:: NEGATIVE
Weight: 261 [lb_av]

## 2018-12-28 ENCOUNTER — Other Ambulatory Visit: Payer: Self-pay

## 2018-12-28 ENCOUNTER — Encounter (HOSPITAL_COMMUNITY): Payer: Self-pay

## 2018-12-28 ENCOUNTER — Ambulatory Visit (HOSPITAL_COMMUNITY)
Admission: RE | Admit: 2018-12-28 | Discharge: 2018-12-28 | Disposition: A | Discharge status: Home / Self Care | Payer: Medicaid Other | Source: Ambulatory Visit | Attending: Obstetrics and Gynecology | Admitting: Obstetrics and Gynecology

## 2018-12-28 ENCOUNTER — Other Ambulatory Visit (HOSPITAL_COMMUNITY): Payer: Self-pay | Admitting: *Deleted

## 2018-12-28 ENCOUNTER — Ambulatory Visit (INDEPENDENT_AMBULATORY_CARE_PROVIDER_SITE_OTHER): Payer: Medicaid Other

## 2018-12-28 ENCOUNTER — Ambulatory Visit (HOSPITAL_COMMUNITY): Payer: Medicaid Other | Admitting: *Deleted

## 2018-12-28 VITALS — BP 137/79 | HR 87 | Temp 98.4°F

## 2018-12-28 VITALS — BP 112/72 | HR 72

## 2018-12-28 DIAGNOSIS — O99212 Obesity complicating pregnancy, second trimester: Secondary | ICD-10-CM | POA: Diagnosis not present

## 2018-12-28 DIAGNOSIS — O09292 Supervision of pregnancy with other poor reproductive or obstetric history, second trimester: Secondary | ICD-10-CM

## 2018-12-28 DIAGNOSIS — O0990 Supervision of high risk pregnancy, unspecified, unspecified trimester: Secondary | ICD-10-CM

## 2018-12-28 DIAGNOSIS — O280 Abnormal hematological finding on antenatal screening of mother: Secondary | ICD-10-CM

## 2018-12-28 DIAGNOSIS — O289 Unspecified abnormal findings on antenatal screening of mother: Secondary | ICD-10-CM | POA: Diagnosis not present

## 2018-12-28 DIAGNOSIS — O09212 Supervision of pregnancy with history of pre-term labor, second trimester: Secondary | ICD-10-CM | POA: Insufficient documentation

## 2018-12-28 DIAGNOSIS — Z3A19 19 weeks gestation of pregnancy: Secondary | ICD-10-CM | POA: Diagnosis not present

## 2018-12-28 DIAGNOSIS — O09892 Supervision of other high risk pregnancies, second trimester: Secondary | ICD-10-CM

## 2018-12-28 DIAGNOSIS — O359XX0 Maternal care for (suspected) fetal abnormality and damage, unspecified, not applicable or unspecified: Secondary | ICD-10-CM

## 2018-12-28 DIAGNOSIS — Z363 Encounter for antenatal screening for malformations: Secondary | ICD-10-CM

## 2018-12-28 MED ORDER — HYDROXYPROGESTERONE CAPROATE 275 MG/1.1ML ~~LOC~~ SOAJ
275.0000 mg | Freq: Once | SUBCUTANEOUS | Status: AC
  Administered 2018-12-28: 275 mg via SUBCUTANEOUS

## 2018-12-28 NOTE — Progress Notes (Signed)
Patient presented to the office today for 17-P injection given in left arm sq. NDC #53748-270-78. Patient tolerated well and will follow up at next office visit.

## 2019-01-04 ENCOUNTER — Ambulatory Visit (INDEPENDENT_AMBULATORY_CARE_PROVIDER_SITE_OTHER): Payer: Medicaid Other | Admitting: *Deleted

## 2019-01-04 ENCOUNTER — Other Ambulatory Visit: Payer: Self-pay

## 2019-01-04 VITALS — BP 126/78 | HR 94

## 2019-01-04 DIAGNOSIS — O0990 Supervision of high risk pregnancy, unspecified, unspecified trimester: Secondary | ICD-10-CM

## 2019-01-04 DIAGNOSIS — O09212 Supervision of pregnancy with history of pre-term labor, second trimester: Secondary | ICD-10-CM

## 2019-01-04 MED ORDER — HYDROXYPROGESTERONE CAPROATE 275 MG/1.1ML ~~LOC~~ SOAJ
275.0000 mg | Freq: Once | SUBCUTANEOUS | Status: AC
  Administered 2019-01-04: 275 mg via SUBCUTANEOUS

## 2019-01-04 NOTE — Progress Notes (Signed)
Pt here for 17p. Pt denies any issues at this time. Pt is having a local irritation on both arm from the injections. Advised to use benadryl cream on areas. Pt tolerated injection well. Will follow up next week.

## 2019-01-10 NOTE — Progress Notes (Signed)
I have reviewed the chart and agree with nursing staff's documentation of this patient's encounter.  [place] Bing, MD 01/10/2019 11:34 AM

## 2019-01-11 ENCOUNTER — Ambulatory Visit (INDEPENDENT_AMBULATORY_CARE_PROVIDER_SITE_OTHER): Payer: Medicaid Other | Admitting: *Deleted

## 2019-01-11 ENCOUNTER — Encounter: Payer: Medicaid Other | Admitting: Family Medicine

## 2019-01-11 ENCOUNTER — Other Ambulatory Visit: Payer: Self-pay

## 2019-01-11 VITALS — BP 133/80 | HR 102

## 2019-01-11 DIAGNOSIS — Z3A22 22 weeks gestation of pregnancy: Secondary | ICD-10-CM | POA: Diagnosis not present

## 2019-01-11 DIAGNOSIS — O0990 Supervision of high risk pregnancy, unspecified, unspecified trimester: Secondary | ICD-10-CM

## 2019-01-11 DIAGNOSIS — O09212 Supervision of pregnancy with history of pre-term labor, second trimester: Secondary | ICD-10-CM

## 2019-01-11 MED ORDER — HYDROXYPROGESTERONE CAPROATE 275 MG/1.1ML ~~LOC~~ SOAJ
275.0000 mg | Freq: Once | SUBCUTANEOUS | Status: AC
  Administered 2019-01-11: 275 mg via SUBCUTANEOUS

## 2019-01-11 NOTE — Progress Notes (Signed)
I have reviewed the chart and agree with nursing staff's documentation of this patient's encounter.  Jaynie Collins, MD 01/11/2019 10:31 AM

## 2019-01-11 NOTE — Progress Notes (Signed)
Pt here today for 17p. Pt denies any issues at this time. Pt tolerated injection well. Will follow next week for next injection and has virtual appointment this week with MD.

## 2019-01-12 ENCOUNTER — Ambulatory Visit (INDEPENDENT_AMBULATORY_CARE_PROVIDER_SITE_OTHER): Payer: Medicaid Other | Admitting: Obstetrics and Gynecology

## 2019-01-12 ENCOUNTER — Other Ambulatory Visit: Payer: Self-pay | Admitting: Obstetrics & Gynecology

## 2019-01-12 VITALS — Wt 255.0 lb

## 2019-01-12 DIAGNOSIS — O09212 Supervision of pregnancy with history of pre-term labor, second trimester: Secondary | ICD-10-CM

## 2019-01-12 DIAGNOSIS — O09899 Supervision of other high risk pregnancies, unspecified trimester: Secondary | ICD-10-CM

## 2019-01-12 DIAGNOSIS — O09522 Supervision of elderly multigravida, second trimester: Secondary | ICD-10-CM

## 2019-01-12 DIAGNOSIS — O283 Abnormal ultrasonic finding on antenatal screening of mother: Secondary | ICD-10-CM

## 2019-01-12 DIAGNOSIS — O0990 Supervision of high risk pregnancy, unspecified, unspecified trimester: Secondary | ICD-10-CM

## 2019-01-12 NOTE — Progress Notes (Signed)
   TELEHEALTH VIRTUAL OBSTETRICS VISIT ENCOUNTER NOTE  I connected with Allison Cameron on 01/12/19 at 11:15 AM EDT by telephone at home and verified that I am speaking with the correct person using two identifiers.   I discussed the limitations, risks, security and privacy concerns of performing an evaluation and management service by telephone and the availability of in person appointments. I also discussed with the patient that there may be a patient responsible charge related to this service. The patient expressed understanding and agreed to proceed.  Subjective:  Allison Cameron is a 33 y.o. [phone] at [redacted]w[redacted]d being followed for ongoing prenatal care.  She is currently monitored for the following issues for this high-risk pregnancy and has History of preterm delivery, currently pregnant; Supervision of high risk pregnancy, antepartum; History of molar pregnancy; BMI 40.0-44.9, adult (HCC); Obesity in pregnancy; Short interval between pregnancies affecting pregnancy, antepartum; AMA (advanced maternal age) multigravida 35+; and Abnormal finding on antenatal ultrasound on their problem list.  Patient reports no complaints. Reports fetal movement. Denies any contractions, bleeding or leaking of fluid.   The following portions of the patient's history were reviewed and updated as appropriate: allergies, current medications, past family history, past medical history, past social history, past surgical history and problem list.   Objective:   Vitals:   01/12/19 1100  Weight: 255 lb (115.7 kg)    Babyscripts Data Reviewed: yes  General:  Alert, oriented and cooperative.   Mental Status: Normal mood and affect perceived. Normal judgment and thought content.  Rest of physical exam deferred due to type of encounter  Assessment and Plan:  Pregnancy: [id number] at [redacted]w[redacted]d 1. Supervision of high risk pregnancy, antepartum Routine care.   2. History of preterm delivery, currently pregnant Getting  qwk 17ps  3. Multigravida of advanced maternal age in second trimester No issues  4. Abnormal finding on antenatal ultrasound inbasket sent to mfm re: fetal echo scheduling with peds cards. Has rpt u/s with [facility]fm later this week  Preterm labor symptoms and general obstetric precautions including but not limited to vaginal bleeding, contractions, leaking of fluid and fetal movement were reviewed in detail with the patient.  I discussed the assessment and treatment plan with the patient. The patient was provided an opportunity to ask questions and all were answered. The patient agreed with the plan and demonstrated an understanding of the instructions. The patient was advised to call back or seek an in-person office evaluation/go to MAU at [facility] for any urgent or concerning symptoms. Please refer to After Visit Summary for other counseling recommendations.   I provided 10 minutes of non-face-to-face time during this encounter. The visit was conducted via Webex-medicine  No follow-ups on file.  Future Appointments  Date Time Provider Department Center  01/12/2019 11:15 AM [place] Bing, MD [facility]  01/26/2019  9:45 AM [facility] NURSE [facility]  01/26/2019  9:45 AM [facility] [place] 5 [facility]    [place] Bing, MD [facility] [employer], [facility]

## 2019-01-12 NOTE — Progress Notes (Signed)
I connected with  Allison Cameron on 01/12/19 at 11:15 AM EDT by telephone and verified that I am speaking with the correct person using two identifiers.   I discussed the limitations, risks, security and privacy concerns of performing an evaluation and management service by telephone and the availability of in person appointments. I also discussed with the patient that there may be a patient responsible charge related to this service. The patient expressed understanding and agreed to proceed.  Scheryl Marten, RN 01/12/2019  11:01 AM   Discuss weight

## 2019-01-18 ENCOUNTER — Other Ambulatory Visit: Payer: Self-pay

## 2019-01-18 ENCOUNTER — Ambulatory Visit (INDEPENDENT_AMBULATORY_CARE_PROVIDER_SITE_OTHER): Payer: Medicaid Other

## 2019-01-18 VITALS — BP 101/72 | HR 72

## 2019-01-18 DIAGNOSIS — O09212 Supervision of pregnancy with history of pre-term labor, second trimester: Secondary | ICD-10-CM

## 2019-01-18 DIAGNOSIS — O0990 Supervision of high risk pregnancy, unspecified, unspecified trimester: Secondary | ICD-10-CM

## 2019-01-18 MED ORDER — HYDROXYPROGESTERONE CAPROATE 275 MG/1.1ML ~~LOC~~ SOAJ
275.0000 mg | Freq: Once | SUBCUTANEOUS | Status: AC
  Administered 2019-01-18: 275 mg via SUBCUTANEOUS

## 2019-01-18 MED ORDER — HYDROXYPROGESTERONE CAPROATE 250 MG/ML IM OIL: 250.0000 mg | TOPICAL_OIL | Freq: Once | INTRAMUSCULAR | Status: DC

## 2019-01-18 NOTE — Progress Notes (Signed)
Patient presented to the office today for 17-p injection received in her right arm. NDC# 44818-563-14. Patient tolerated well and will follow up in one week for next injection.

## 2019-01-25 ENCOUNTER — Other Ambulatory Visit: Payer: Self-pay

## 2019-01-25 ENCOUNTER — Ambulatory Visit (INDEPENDENT_AMBULATORY_CARE_PROVIDER_SITE_OTHER): Payer: Medicaid Other

## 2019-01-25 VITALS — BP 116/76 | HR 72

## 2019-01-25 DIAGNOSIS — Z3A23 23 weeks gestation of pregnancy: Secondary | ICD-10-CM | POA: Diagnosis not present

## 2019-01-25 DIAGNOSIS — O09212 Supervision of pregnancy with history of pre-term labor, second trimester: Secondary | ICD-10-CM

## 2019-01-25 DIAGNOSIS — O0990 Supervision of high risk pregnancy, unspecified, unspecified trimester: Secondary | ICD-10-CM

## 2019-01-25 MED ORDER — HYDROXYPROGESTERONE CAPROATE 275 MG/1.1ML ~~LOC~~ SOAJ
275.0000 mg | Freq: Once | SUBCUTANEOUS | Status: AC
  Administered 2019-01-25: 10:00:00 275 mg via SUBCUTANEOUS

## 2019-01-25 NOTE — Progress Notes (Signed)
Patient presented to the office for her 17-p injection received in her left arm IM. Patient tolerated well and will follow up in one week for her next injection.

## 2019-01-26 ENCOUNTER — Ambulatory Visit (HOSPITAL_COMMUNITY)
Admission: RE | Admit: 2019-01-26 | Discharge: 2019-01-26 | Disposition: A | Discharge status: Home / Self Care | Payer: Medicaid Other | Source: Ambulatory Visit | Attending: Maternal & Fetal Medicine | Admitting: Maternal & Fetal Medicine

## 2019-01-26 ENCOUNTER — Encounter (HOSPITAL_COMMUNITY): Payer: Self-pay

## 2019-01-26 ENCOUNTER — Ambulatory Visit (HOSPITAL_COMMUNITY): Payer: Medicaid Other | Admitting: *Deleted

## 2019-01-26 VITALS — BP 119/61 | HR 89 | Temp 98.5°F

## 2019-01-26 DIAGNOSIS — O09212 Supervision of pregnancy with history of pre-term labor, second trimester: Secondary | ICD-10-CM

## 2019-01-26 DIAGNOSIS — O09892 Supervision of other high risk pregnancies, second trimester: Secondary | ICD-10-CM | POA: Diagnosis not present

## 2019-01-26 DIAGNOSIS — Z362 Encounter for other antenatal screening follow-up: Secondary | ICD-10-CM

## 2019-01-26 DIAGNOSIS — O09292 Supervision of pregnancy with other poor reproductive or obstetric history, second trimester: Secondary | ICD-10-CM | POA: Diagnosis not present

## 2019-01-26 DIAGNOSIS — O99212 Obesity complicating pregnancy, second trimester: Secondary | ICD-10-CM

## 2019-01-26 DIAGNOSIS — O0990 Supervision of high risk pregnancy, unspecified, unspecified trimester: Secondary | ICD-10-CM | POA: Diagnosis not present

## 2019-01-26 DIAGNOSIS — Z3A24 24 weeks gestation of pregnancy: Secondary | ICD-10-CM | POA: Diagnosis not present

## 2019-01-26 DIAGNOSIS — O359XX0 Maternal care for (suspected) fetal abnormality and damage, unspecified, not applicable or unspecified: Secondary | ICD-10-CM | POA: Diagnosis not present

## 2019-01-26 DIAGNOSIS — O289 Unspecified abnormal findings on antenatal screening of mother: Secondary | ICD-10-CM

## 2019-01-27 DIAGNOSIS — Q210 Ventricular septal defect: Secondary | ICD-10-CM | POA: Diagnosis not present

## 2019-02-01 ENCOUNTER — Other Ambulatory Visit: Payer: Self-pay

## 2019-02-01 ENCOUNTER — Ambulatory Visit (INDEPENDENT_AMBULATORY_CARE_PROVIDER_SITE_OTHER): Payer: Medicaid Other | Admitting: *Deleted

## 2019-02-01 VITALS — BP 130/79 | HR 103

## 2019-02-01 DIAGNOSIS — Z3A24 24 weeks gestation of pregnancy: Secondary | ICD-10-CM | POA: Diagnosis not present

## 2019-02-01 DIAGNOSIS — O09212 Supervision of pregnancy with history of pre-term labor, second trimester: Secondary | ICD-10-CM | POA: Diagnosis not present

## 2019-02-01 DIAGNOSIS — O0990 Supervision of high risk pregnancy, unspecified, unspecified trimester: Secondary | ICD-10-CM

## 2019-02-01 MED ORDER — HYDROXYPROGESTERONE CAPROATE 275 MG/1.1ML ~~LOC~~ SOAJ
275.0000 mg | Freq: Once | SUBCUTANEOUS | Status: AC
  Administered 2019-02-01: 10:00:00 275 mg via SUBCUTANEOUS

## 2019-02-01 NOTE — Progress Notes (Signed)
Pt here today for 17p. Pt denies any issues at this time. Pt tolerated injection well. Will follow up next week for next injection.   Scheryl Marten, RN

## 2019-02-04 NOTE — Progress Notes (Signed)
I have reviewed the chart and agree with nursing staff's documentation of this patient's encounter.  [place] Bing, MD 02/04/2019 2:27 PM

## 2019-02-08 ENCOUNTER — Ambulatory Visit (INDEPENDENT_AMBULATORY_CARE_PROVIDER_SITE_OTHER): Payer: Medicaid Other | Admitting: Obstetrics & Gynecology

## 2019-02-08 ENCOUNTER — Other Ambulatory Visit: Payer: Self-pay

## 2019-02-08 ENCOUNTER — Encounter: Payer: Self-pay | Admitting: *Deleted

## 2019-02-08 VITALS — BP 131/82 | HR 101 | Wt 254.0 lb

## 2019-02-08 DIAGNOSIS — O09522 Supervision of elderly multigravida, second trimester: Secondary | ICD-10-CM

## 2019-02-08 DIAGNOSIS — Z3A25 25 weeks gestation of pregnancy: Secondary | ICD-10-CM

## 2019-02-08 DIAGNOSIS — Z6841 Body Mass Index (BMI) 40.0 and over, adult: Secondary | ICD-10-CM

## 2019-02-08 DIAGNOSIS — O09212 Supervision of pregnancy with history of pre-term labor, second trimester: Secondary | ICD-10-CM

## 2019-02-08 DIAGNOSIS — O0990 Supervision of high risk pregnancy, unspecified, unspecified trimester: Secondary | ICD-10-CM

## 2019-02-08 DIAGNOSIS — O0992 Supervision of high risk pregnancy, unspecified, second trimester: Secondary | ICD-10-CM

## 2019-02-08 DIAGNOSIS — O09899 Supervision of other high risk pregnancies, unspecified trimester: Secondary | ICD-10-CM

## 2019-02-08 DIAGNOSIS — O99210 Obesity complicating pregnancy, unspecified trimester: Secondary | ICD-10-CM

## 2019-02-08 DIAGNOSIS — O99212 Obesity complicating pregnancy, second trimester: Secondary | ICD-10-CM

## 2019-02-08 MED ORDER — HYDROXYPROGESTERONE CAPROATE 275 MG/1.1ML ~~LOC~~ SOAJ
275.0000 mg | Freq: Once | SUBCUTANEOUS | Status: AC
  Administered 2019-02-08: 275 mg via SUBCUTANEOUS

## 2019-02-08 NOTE — Progress Notes (Signed)
   PRENATAL VISIT NOTE  Subjective:  Allison Cameron is a 33 y.o. [phone] at [redacted]w[redacted]d being seen today for ongoing prenatal care.  She is currently monitored for the following issues for this high-risk pregnancy and has History of preterm delivery, currently pregnant; Supervision of high risk pregnancy, antepartum; History of molar pregnancy; BMI 40.0-44.9, adult ([place]); Obesity in pregnancy; Short interval between pregnancies affecting pregnancy, antepartum; AMA (advanced maternal age) multigravida 35+; and Abnormal finding on antenatal ultrasound on their problem list.  Patient reports no complaints.  Contractions: Not present. Vag. Bleeding: None.  Movement: Present. Denies leaking of fluid.   The following portions of the patient's history were reviewed and updated as appropriate: allergies, current medications, past family history, past medical history, past social history, past surgical history and problem list.   Objective:   Vitals:   02/08/19 0952  BP: 131/82  Pulse: (!) 101  Weight: 254 lb (115.2 kg)    Fetal Status: Fetal Heart Rate (bpm): 154   Movement: Present     General:  Alert, oriented and cooperative. Patient is in no acute distress.  Skin: Skin is warm and dry. No rash noted.   Cardiovascular: Normal heart rate noted  Respiratory: Normal respiratory effort, no problems with respiration noted  Abdomen: Soft, gravid, appropriate for gestational age.  Pain/Pressure: Absent     Pelvic: Cervical exam deferred        Extremities: Normal range of motion.  Edema: None  Mental Status: Normal mood and affect. Normal behavior. Normal judgment and thought content.   Assessment and Plan:  Pregnancy: [id number] at [redacted]w[redacted]d 1. Supervision of high risk pregnancy, antepartum   2. History of preterm delivery, currently pregnant - getting 17 P, here weekly  3. Multigravida of advanced maternal age in second trimester   4. Obesity in pregnancy   5. BMI 40.0-44.9, adult ([place])    Preterm labor symptoms and general obstetric precautions including but not limited to vaginal bleeding, contractions, leaking of fluid and fetal movement were reviewed in detail with the patient. Please refer to After Visit Summary for other counseling recommendations.   No follow-ups on file.  No future appointments.  Emily Filbert, MD

## 2019-02-15 ENCOUNTER — Other Ambulatory Visit: Payer: Self-pay

## 2019-02-15 ENCOUNTER — Ambulatory Visit (INDEPENDENT_AMBULATORY_CARE_PROVIDER_SITE_OTHER): Payer: Medicaid Other

## 2019-02-15 VITALS — BP 139/76 | HR 102

## 2019-02-15 DIAGNOSIS — O09212 Supervision of pregnancy with history of pre-term labor, second trimester: Secondary | ICD-10-CM

## 2019-02-15 DIAGNOSIS — Z3A27 27 weeks gestation of pregnancy: Secondary | ICD-10-CM

## 2019-02-15 DIAGNOSIS — O09899 Supervision of other high risk pregnancies, unspecified trimester: Secondary | ICD-10-CM

## 2019-02-15 MED ORDER — HYDROXYPROGESTERONE CAPROATE 275 MG/1.1ML ~~LOC~~ SOAJ
275.0000 mg | Freq: Once | SUBCUTANEOUS | Status: AC
  Administered 2019-02-15: 275 mg via SUBCUTANEOUS

## 2019-02-15 NOTE — Progress Notes (Signed)
Patient seen and assessed by nursing staff.  Agree with documentation and plan.  

## 2019-02-15 NOTE — Progress Notes (Signed)
Pt presents for 17p. Inj given in right arm. Pt tolerated well. Pt is scheduled for next week Injection.

## 2019-02-22 ENCOUNTER — Other Ambulatory Visit: Payer: Self-pay

## 2019-02-22 ENCOUNTER — Ambulatory Visit (INDEPENDENT_AMBULATORY_CARE_PROVIDER_SITE_OTHER): Payer: Medicaid Other | Admitting: *Deleted

## 2019-02-22 VITALS — BP 119/74 | HR 103

## 2019-02-22 DIAGNOSIS — O09212 Supervision of pregnancy with history of pre-term labor, second trimester: Secondary | ICD-10-CM | POA: Diagnosis not present

## 2019-02-22 DIAGNOSIS — Z3A27 27 weeks gestation of pregnancy: Secondary | ICD-10-CM

## 2019-02-22 DIAGNOSIS — O0990 Supervision of high risk pregnancy, unspecified, unspecified trimester: Secondary | ICD-10-CM

## 2019-02-22 MED ORDER — HYDROXYPROGESTERONE CAPROATE 275 MG/1.1ML ~~LOC~~ SOAJ
275.0000 mg | Freq: Once | SUBCUTANEOUS | Status: AC
  Administered 2019-02-22: 09:00:00 275 mg via SUBCUTANEOUS

## 2019-02-22 NOTE — Progress Notes (Signed)
Pt here today for 17p. Denies any issues at this time, pt tolerated injection well. Will follow up next week.

## 2019-02-22 NOTE — Progress Notes (Signed)
I have reviewed the chart and agree with nursing staff's documentation of this patient's encounter.  Ugonna Anyanwu, MD 02/22/2019 9:26 AM    

## 2019-03-01 ENCOUNTER — Other Ambulatory Visit: Payer: Self-pay

## 2019-03-01 ENCOUNTER — Ambulatory Visit (INDEPENDENT_AMBULATORY_CARE_PROVIDER_SITE_OTHER): Payer: Medicaid Other | Admitting: Family Medicine

## 2019-03-01 ENCOUNTER — Encounter: Payer: Self-pay | Admitting: Family Medicine

## 2019-03-01 VITALS — BP 127/83 | HR 106 | Wt 257.0 lb

## 2019-03-01 DIAGNOSIS — O09213 Supervision of pregnancy with history of pre-term labor, third trimester: Secondary | ICD-10-CM | POA: Diagnosis not present

## 2019-03-01 DIAGNOSIS — Z23 Encounter for immunization: Secondary | ICD-10-CM

## 2019-03-01 DIAGNOSIS — O0990 Supervision of high risk pregnancy, unspecified, unspecified trimester: Secondary | ICD-10-CM | POA: Diagnosis not present

## 2019-03-01 DIAGNOSIS — O09893 Supervision of other high risk pregnancies, third trimester: Secondary | ICD-10-CM

## 2019-03-01 DIAGNOSIS — O0993 Supervision of high risk pregnancy, unspecified, third trimester: Secondary | ICD-10-CM

## 2019-03-01 DIAGNOSIS — Z3A28 28 weeks gestation of pregnancy: Secondary | ICD-10-CM

## 2019-03-01 DIAGNOSIS — O283 Abnormal ultrasonic finding on antenatal screening of mother: Secondary | ICD-10-CM

## 2019-03-01 DIAGNOSIS — O09899 Supervision of other high risk pregnancies, unspecified trimester: Secondary | ICD-10-CM

## 2019-03-01 MED ORDER — HYDROXYPROGESTERONE CAPROATE 275 MG/1.1ML ~~LOC~~ SOAJ
275.0000 mg | Freq: Once | SUBCUTANEOUS | Status: AC
  Administered 2019-03-01: 275 mg via SUBCUTANEOUS

## 2019-03-01 NOTE — Patient Instructions (Signed)

## 2019-03-01 NOTE — Progress Notes (Signed)
   PRENATAL VISIT NOTE  Subjective:  Allison Cameron is a 33 y.o. [phone] at [redacted]w[redacted]d being seen today for ongoing prenatal care.  She is currently monitored for the following issues for this high-risk pregnancy and has History of preterm delivery, currently pregnant; Supervision of high risk pregnancy, antepartum; History of molar pregnancy; BMI 40.0-44.9, adult ([place]); Obesity in pregnancy; Short interval between pregnancies affecting pregnancy, antepartum; AMA (advanced maternal age) multigravida 35+; and Abnormal finding on antenatal ultrasound on their problem list.  Patient reports no complaints.  Contractions: Not present. Vag. Bleeding: None.  Movement: Present. Denies leaking of fluid.   The following portions of the patient's history were reviewed and updated as appropriate: allergies, current medications, past family history, past medical history, past social history, past surgical history and problem list.   Objective:   Vitals:   03/01/19 0852  BP: 127/83  Pulse: (!) 106  Weight: 257 lb (116.6 kg)    Fetal Status: Fetal Heart Rate (bpm): 145 Fundal Height: 30 cm Movement: Present     General:  Alert, oriented and cooperative. Patient is in no acute distress.  Skin: Skin is warm and dry. No rash noted.   Cardiovascular: Normal heart rate noted  Respiratory: Normal respiratory effort, no problems with respiration noted  Abdomen: Soft, gravid, appropriate for gestational age.  Pain/Pressure: Absent     Pelvic: Cervical exam deferred        Extremities: Normal range of motion.  Edema: Trace  Mental Status: Normal mood and affect. Normal behavior. Normal judgment and thought content.   Assessment and Plan:  Pregnancy: [id number] at [redacted]w[redacted]d 1. Supervision of high risk pregnancy, antepartum 28 wk labs and TDaP today - Glucose Tolerance, 2 Hours w/1 Hour - CBC - RPR - HIV Antibody (routine testing w rflx)  2. History of preterm delivery, currently pregnant Continue weekly 17 P   3. Abnormal finding on antenatal ultrasound ? VSD--normal fetal echo  4. Short interval between pregnancies affecting pregnancy, antepartum   Preterm labor symptoms and general obstetric precautions including but not limited to vaginal bleeding, contractions, leaking of fluid and fetal movement were reviewed in detail with the patient. Please refer to After Visit Summary for other counseling recommendations.   Return in 2 weeks (on 03/15/2019) for 17 P weekly, in person.  Future Appointments  Date Time Provider [place]  03/08/2019  9:00 AM [facility] NURSE [facility]  03/15/2019 11:15 AM [place] Jude, MD [facility]  03/22/2019  9:15 AM [facility] NURSE [facility]    Donnamae Jude, MD

## 2019-03-03 LAB — HIV ANTIBODY (ROUTINE TESTING W REFLEX): HIV Screen 4th Generation wRfx: NONREACTIVE

## 2019-03-03 LAB — GLUCOSE TOLERANCE, 2 HOURS W/ 1HR
Glucose, 1 hour: 169 mg/dL (ref 65–179)
Glucose, 2 hour: 107 mg/dL (ref 65–152)
Glucose, Fasting: 83 mg/dL (ref 65–91)

## 2019-03-03 LAB — CBC
Hematocrit: 32.7 % — ABNORMAL LOW (ref 34.0–46.6)
Hemoglobin: 11.2 g/dL (ref 11.1–15.9)
MCH: 29.8 pg (ref 26.6–33.0)
MCHC: 34.3 g/dL (ref 31.5–35.7)
MCV: 87 fL (ref 79–97)
Platelets: 311 10*3/uL (ref 150–450)
RBC: 3.76 x10E6/uL — ABNORMAL LOW (ref 3.77–5.28)
RDW: 13.3 % (ref 11.7–15.4)
WBC: 7.7 10*3/uL (ref 3.4–10.8)

## 2019-03-03 LAB — RPR: RPR Ser Ql: NONREACTIVE

## 2019-03-08 ENCOUNTER — Ambulatory Visit (INDEPENDENT_AMBULATORY_CARE_PROVIDER_SITE_OTHER): Payer: Medicaid Other | Admitting: *Deleted

## 2019-03-08 ENCOUNTER — Other Ambulatory Visit: Payer: Self-pay

## 2019-03-08 VITALS — BP 129/81 | HR 108

## 2019-03-08 DIAGNOSIS — O09213 Supervision of pregnancy with history of pre-term labor, third trimester: Secondary | ICD-10-CM

## 2019-03-08 DIAGNOSIS — Z3A29 29 weeks gestation of pregnancy: Secondary | ICD-10-CM | POA: Diagnosis not present

## 2019-03-08 DIAGNOSIS — O0990 Supervision of high risk pregnancy, unspecified, unspecified trimester: Secondary | ICD-10-CM

## 2019-03-08 MED ORDER — HYDROXYPROGESTERONE CAPROATE 275 MG/1.1ML ~~LOC~~ SOAJ
275.0000 mg | Freq: Once | SUBCUTANEOUS | Status: AC
  Administered 2019-03-08: 275 mg via SUBCUTANEOUS

## 2019-03-08 NOTE — Progress Notes (Signed)
Patient seen and assessed by nursing staff during this encounter. I have reviewed the chart and agree with the documentation and plan.  Dezyre Hoefer, MD 03/08/2019 9:23 AM    

## 2019-03-08 NOTE — Progress Notes (Signed)
Pt here for 17p, denies any issues or complaints at this time. Pt tolerated injection well. Pt to follow up next week.

## 2019-03-15 ENCOUNTER — Other Ambulatory Visit: Payer: Self-pay

## 2019-03-15 ENCOUNTER — Ambulatory Visit (INDEPENDENT_AMBULATORY_CARE_PROVIDER_SITE_OTHER): Payer: Medicaid Other | Admitting: Family Medicine

## 2019-03-15 VITALS — BP 129/79 | HR 92 | Temp 98.0°F | Wt 256.4 lb

## 2019-03-15 DIAGNOSIS — O0990 Supervision of high risk pregnancy, unspecified, unspecified trimester: Secondary | ICD-10-CM

## 2019-03-15 DIAGNOSIS — O0993 Supervision of high risk pregnancy, unspecified, third trimester: Secondary | ICD-10-CM

## 2019-03-15 DIAGNOSIS — O09899 Supervision of other high risk pregnancies, unspecified trimester: Secondary | ICD-10-CM

## 2019-03-15 DIAGNOSIS — Z3A30 30 weeks gestation of pregnancy: Secondary | ICD-10-CM

## 2019-03-15 DIAGNOSIS — O09213 Supervision of pregnancy with history of pre-term labor, third trimester: Secondary | ICD-10-CM

## 2019-03-15 MED ORDER — PRENATAL VITAMIN PLUS LOW IRON 27-1 MG PO TABS: 1.0000 | ORAL_TABLET | Freq: Every day | ORAL | 3 refills | Status: AC

## 2019-03-15 MED ORDER — PREPLUS 27-1 MG PO TABS: 1.0000 | ORAL_TABLET | Freq: Every day | ORAL | 13 refills | Status: AC

## 2019-03-15 MED ORDER — HYDROXYPROGESTERONE CAPROATE 275 MG/1.1ML ~~LOC~~ SOAJ
275.0000 mg | Freq: Once | SUBCUTANEOUS | Status: AC
  Administered 2019-03-15: 12:00:00 275 mg via SUBCUTANEOUS

## 2019-03-15 NOTE — Progress Notes (Signed)
<>   makena today

## 2019-03-15 NOTE — Progress Notes (Signed)
    PRENATAL VISIT NOTE  Subjective:  Allison Cameron is a 33 y.o. [phone] at [redacted]w[redacted]d being seen today for ongoing prenatal care.  She is currently monitored for the following issues for this high-risk pregnancy and has History of preterm delivery, currently pregnant; Supervision of high risk pregnancy, antepartum; History of molar pregnancy; BMI 40.0-44.9, adult ([place]); Obesity in pregnancy; Short interval between pregnancies affecting pregnancy, antepartum; AMA (advanced maternal age) multigravida 35+; and Abnormal finding on antenatal ultrasound on their problem list.  Patient reports no complaints.  Contractions: Not present.  .  Movement: Present. Denies leaking of fluid.   The following portions of the patient's history were reviewed and updated as appropriate: allergies, current medications, past family history, past medical history, past social history, past surgical history and problem list.   Objective:   Vitals:   03/15/19 1131  BP: 129/79  Pulse: 92  Temp: 98 F (36.7 C)  Weight: 256 lb 6.4 oz (116.3 kg)    Fetal Status: Fetal Heart Rate (bpm): 138 Fundal Height: 31 cm Movement: Present     General:  Alert, oriented and cooperative. Patient is in no acute distress.  Skin: Skin is warm and dry. No rash noted.   Cardiovascular: Normal heart rate noted  Respiratory: Normal respiratory effort, no problems with respiration noted  Abdomen: Soft, gravid, appropriate for gestational age.  Pain/Pressure: Absent     Pelvic: Cervical exam deferred        Extremities: Normal range of motion.  Edema: Trace  Mental Status: Normal mood and affect. Normal behavior. Normal judgment and thought content.   Assessment and Plan:  Pregnancy: [id number] at [redacted]w[redacted]d 1. Supervision of high risk pregnancy, antepartum Continue prenatal care.  - Prenatal Vit-Fe Fumarate-FA (PRENATAL VITAMIN PLUS LOW IRON) 27-1 MG TABS; Take 1 tablet by mouth daily.  Dispense: 90 tablet; Refill: 3  2. History of  preterm delivery, currently pregnant Weekly 17 P No s/sx's of PTL today  Preterm labor symptoms and general obstetric precautions including but not limited to vaginal bleeding, contractions, leaking of fluid and fetal movement were reviewed in detail with the patient. Please refer to After Visit Summary for other counseling recommendations.   Return in 2 weeks (on 03/29/2019) for 17 P weekly.  Future Appointments  Date Time Provider [place]  03/22/2019  9:15 AM [facility] NURSE [facility]  03/29/2019 10:00 AM [facility] NURSE [facility]  04/05/2019 10:00 AM [facility] NURSE [facility]    Donnamae Jude, MD

## 2019-03-15 NOTE — Patient Instructions (Signed)

## 2019-03-22 ENCOUNTER — Ambulatory Visit (INDEPENDENT_AMBULATORY_CARE_PROVIDER_SITE_OTHER): Payer: Medicaid Other | Admitting: Obstetrics & Gynecology

## 2019-03-22 ENCOUNTER — Other Ambulatory Visit: Payer: Self-pay

## 2019-03-22 VITALS — BP 120/77 | HR 72

## 2019-03-22 DIAGNOSIS — Z3A31 31 weeks gestation of pregnancy: Secondary | ICD-10-CM | POA: Diagnosis not present

## 2019-03-22 DIAGNOSIS — O09213 Supervision of pregnancy with history of pre-term labor, third trimester: Secondary | ICD-10-CM

## 2019-03-22 DIAGNOSIS — O09899 Supervision of other high risk pregnancies, unspecified trimester: Secondary | ICD-10-CM

## 2019-03-22 MED ORDER — HYDROXYPROGESTERONE CAPROATE 275 MG/1.1ML ~~LOC~~ SOAJ
275.0000 mg | Freq: Once | SUBCUTANEOUS | Status: AC
  Administered 2019-03-22: 275 mg via SUBCUTANEOUS

## 2019-03-22 NOTE — Progress Notes (Signed)
Patient presented to the office today for her 17-P injection received in left arm IM. [place] # [id number]. Patient tolerated well and will follow up in one week for her next injection.

## 2019-03-29 ENCOUNTER — Encounter: Payer: Self-pay | Admitting: Family Medicine

## 2019-03-29 ENCOUNTER — Ambulatory Visit: Payer: Medicaid Other

## 2019-03-29 ENCOUNTER — Ambulatory Visit (INDEPENDENT_AMBULATORY_CARE_PROVIDER_SITE_OTHER): Payer: Medicaid Other | Admitting: Family Medicine

## 2019-03-29 ENCOUNTER — Other Ambulatory Visit: Payer: Self-pay

## 2019-03-29 VITALS — BP 127/79 | HR 102 | Wt 259.0 lb

## 2019-03-29 DIAGNOSIS — O09213 Supervision of pregnancy with history of pre-term labor, third trimester: Secondary | ICD-10-CM | POA: Diagnosis not present

## 2019-03-29 DIAGNOSIS — O99210 Obesity complicating pregnancy, unspecified trimester: Secondary | ICD-10-CM

## 2019-03-29 DIAGNOSIS — O09523 Supervision of elderly multigravida, third trimester: Secondary | ICD-10-CM

## 2019-03-29 DIAGNOSIS — O26843 Uterine size-date discrepancy, third trimester: Secondary | ICD-10-CM

## 2019-03-29 DIAGNOSIS — Z3A32 32 weeks gestation of pregnancy: Secondary | ICD-10-CM

## 2019-03-29 DIAGNOSIS — O0993 Supervision of high risk pregnancy, unspecified, third trimester: Secondary | ICD-10-CM

## 2019-03-29 DIAGNOSIS — O09899 Supervision of other high risk pregnancies, unspecified trimester: Secondary | ICD-10-CM

## 2019-03-29 DIAGNOSIS — O99213 Obesity complicating pregnancy, third trimester: Secondary | ICD-10-CM

## 2019-03-29 DIAGNOSIS — O0990 Supervision of high risk pregnancy, unspecified, unspecified trimester: Secondary | ICD-10-CM

## 2019-03-29 DIAGNOSIS — O09893 Supervision of other high risk pregnancies, third trimester: Secondary | ICD-10-CM

## 2019-03-29 MED ORDER — HYDROXYPROGESTERONE CAPROATE 275 MG/1.1ML ~~LOC~~ SOAJ
275.0000 mg | Freq: Once | SUBCUTANEOUS | Status: AC
  Administered 2019-03-29: 275 mg via SUBCUTANEOUS

## 2019-03-29 NOTE — Progress Notes (Signed)
   PRENATAL VISIT NOTE  Subjective:  Allison Cameron is a 33 y.o. [phone] at [redacted]w[redacted]d being seen today for ongoing prenatal care.  She is currently monitored for the following issues for this high-risk pregnancy and has History of preterm delivery, currently pregnant; Supervision of high risk pregnancy, antepartum; History of molar pregnancy; BMI 40.0-44.9, adult ([place]); Obesity in pregnancy; Short interval between pregnancies affecting pregnancy, antepartum; AMA (advanced maternal age) multigravida 35+; and Abnormal finding on antenatal ultrasound on their problem list.  Patient reports no complaints.  Contractions: Not present. Vag. Bleeding: None.  Movement: Present. Denies leaking of fluid.   The following portions of the patient's history were reviewed and updated as appropriate: allergies, current medications, past family history, past medical history, past social history, past surgical history and problem list.   Objective:   Vitals:   03/29/19 1007  BP: 127/79  Pulse: (!) 102  Weight: 259 lb (117.5 kg)    Fetal Status: Fetal Heart Rate (bpm): 148   Movement: Present     General:  Alert, oriented and cooperative. Patient is in no acute distress.  Skin: Skin is warm and dry. No rash noted.   Cardiovascular: Normal heart rate noted  Respiratory: Normal respiratory effort, no problems with respiration noted  Abdomen: Soft, gravid, appropriate for gestational age.  Pain/Pressure: Absent     Pelvic: Cervical exam deferred        Extremities: Normal range of motion.  Edema: Trace  Mental Status: Normal mood and affect. Normal behavior. Normal judgment and thought content.   Assessment and Plan:  Pregnancy: [id number] at [redacted]w[redacted]d 1. History of preterm delivery, currently pregnant 17 P today, continue to 36wk  2. Supervision of high risk pregnancy, antepartum UTD Reviewed OB box and BTL desire S>D on FH, will get [place] - [place] MFM OB FOLLOW UP; Future  3. Obesity in pregnancy TWG=8 lb (3.629  kg) which is at goal  4. Short interval between pregnancies affecting pregnancy, antepartum Monitor FH/Growth  5. Multigravida of advanced maternal age in third trimester Genetic screening neg  6. Uterine size date discrepancy pregnancy, third trimester [place] ordered per above   Preterm labor symptoms and general obstetric precautions including but not limited to vaginal bleeding, contractions, leaking of fluid and fetal movement were reviewed in detail with the patient. Please refer to After Visit Summary for other counseling recommendations.   Return in about 2 weeks (around 04/12/2019) for Telehealth/Virtual health OB Visit, Routine prenatal care.  Future Appointments  Date Time Provider [place]  04/05/2019 10:00 AM [facility] NURSE [facility]    Caren Macadam, MD

## 2019-04-04 ENCOUNTER — Inpatient Hospital Stay (HOSPITAL_COMMUNITY)
Admission: AD | Admit: 2019-04-04 | Discharge: 2019-04-07 | DRG: 798 | Disposition: A | Discharge status: Home / Self Care | Payer: Medicaid Other | Attending: Obstetrics & Gynecology | Admitting: Obstetrics & Gynecology

## 2019-04-04 ENCOUNTER — Ambulatory Visit (HOSPITAL_COMMUNITY)
Admission: RE | Admit: 2019-04-04 | Discharge: 2019-04-04 | Disposition: A | Discharge status: Home / Self Care | Payer: Medicaid Other | Source: Ambulatory Visit | Attending: Family Medicine | Admitting: Family Medicine

## 2019-04-04 ENCOUNTER — Other Ambulatory Visit: Payer: Self-pay

## 2019-04-04 ENCOUNTER — Encounter (HOSPITAL_COMMUNITY): Payer: Self-pay

## 2019-04-04 ENCOUNTER — Ambulatory Visit (HOSPITAL_COMMUNITY): Payer: Medicaid Other | Admitting: *Deleted

## 2019-04-04 VITALS — BP 148/92 | HR 122 | Temp 98.3°F

## 2019-04-04 DIAGNOSIS — O283 Abnormal ultrasonic finding on antenatal screening of mother: Secondary | ICD-10-CM | POA: Diagnosis present

## 2019-04-04 DIAGNOSIS — E669 Obesity, unspecified: Secondary | ICD-10-CM | POA: Diagnosis present

## 2019-04-04 DIAGNOSIS — O09529 Supervision of elderly multigravida, unspecified trimester: Secondary | ICD-10-CM | POA: Insufficient documentation

## 2019-04-04 DIAGNOSIS — O133 Gestational [pregnancy-induced] hypertension without significant proteinuria, third trimester: Secondary | ICD-10-CM

## 2019-04-04 DIAGNOSIS — R030 Elevated blood-pressure reading, without diagnosis of hypertension: Secondary | ICD-10-CM | POA: Diagnosis present

## 2019-04-04 DIAGNOSIS — O09899 Supervision of other high risk pregnancies, unspecified trimester: Secondary | ICD-10-CM

## 2019-04-04 DIAGNOSIS — Z362 Encounter for other antenatal screening follow-up: Secondary | ICD-10-CM | POA: Diagnosis not present

## 2019-04-04 DIAGNOSIS — Z302 Encounter for sterilization: Secondary | ICD-10-CM

## 2019-04-04 DIAGNOSIS — O09293 Supervision of pregnancy with other poor reproductive or obstetric history, third trimester: Secondary | ICD-10-CM | POA: Diagnosis not present

## 2019-04-04 DIAGNOSIS — O99213 Obesity complicating pregnancy, third trimester: Secondary | ICD-10-CM | POA: Diagnosis not present

## 2019-04-04 DIAGNOSIS — O0990 Supervision of high risk pregnancy, unspecified, unspecified trimester: Secondary | ICD-10-CM

## 2019-04-04 DIAGNOSIS — O134 Gestational [pregnancy-induced] hypertension without significant proteinuria, complicating childbirth: Secondary | ICD-10-CM | POA: Diagnosis present

## 2019-04-04 DIAGNOSIS — Z3A33 33 weeks gestation of pregnancy: Secondary | ICD-10-CM

## 2019-04-04 DIAGNOSIS — Z9851 Tubal ligation status: Secondary | ICD-10-CM

## 2019-04-04 DIAGNOSIS — Z20828 Contact with and (suspected) exposure to other viral communicable diseases: Secondary | ICD-10-CM | POA: Diagnosis present

## 2019-04-04 DIAGNOSIS — O99214 Obesity complicating childbirth: Secondary | ICD-10-CM | POA: Diagnosis present

## 2019-04-04 DIAGNOSIS — Z6841 Body Mass Index (BMI) 40.0 and over, adult: Secondary | ICD-10-CM

## 2019-04-04 DIAGNOSIS — O289 Unspecified abnormal findings on antenatal screening of mother: Secondary | ICD-10-CM

## 2019-04-04 DIAGNOSIS — O42913 Preterm premature rupture of membranes, unspecified as to length of time between rupture and onset of labor, third trimester: Principal | ICD-10-CM | POA: Diagnosis present

## 2019-04-04 DIAGNOSIS — O09213 Supervision of pregnancy with history of pre-term labor, third trimester: Secondary | ICD-10-CM | POA: Diagnosis not present

## 2019-04-04 DIAGNOSIS — O09893 Supervision of other high risk pregnancies, third trimester: Secondary | ICD-10-CM | POA: Diagnosis not present

## 2019-04-04 DIAGNOSIS — O42919 Preterm premature rupture of membranes, unspecified as to length of time between rupture and onset of labor, unspecified trimester: Secondary | ICD-10-CM | POA: Diagnosis present

## 2019-04-04 DIAGNOSIS — O42013 Preterm premature rupture of membranes, onset of labor within 24 hours of rupture, third trimester: Secondary | ICD-10-CM | POA: Diagnosis not present

## 2019-04-04 DIAGNOSIS — O99210 Obesity complicating pregnancy, unspecified trimester: Secondary | ICD-10-CM | POA: Diagnosis present

## 2019-04-04 DIAGNOSIS — O139 Gestational [pregnancy-induced] hypertension without significant proteinuria, unspecified trimester: Secondary | ICD-10-CM | POA: Diagnosis present

## 2019-04-04 LAB — CBC
HCT: 32.6 % — ABNORMAL LOW (ref 36.0–46.0)
Hemoglobin: 10.9 g/dL — ABNORMAL LOW (ref 12.0–15.0)
MCH: 30 pg (ref 26.0–34.0)
MCHC: 33.4 g/dL (ref 30.0–36.0)
MCV: 89.8 fL (ref 80.0–100.0)
Platelets: 284 10*3/uL (ref 150–400)
RBC: 3.63 MIL/uL — ABNORMAL LOW (ref 3.87–5.11)
RDW: 13.4 % (ref 11.5–15.5)
WBC: 8.9 10*3/uL (ref 4.0–10.5)
nRBC: 0 % (ref 0.0–0.2)

## 2019-04-04 LAB — COMPREHENSIVE METABOLIC PANEL
ALT: 17 U/L (ref 0–44)
AST: 19 U/L (ref 15–41)
Albumin: 2.7 g/dL — ABNORMAL LOW (ref 3.5–5.0)
Alkaline Phosphatase: 120 U/L (ref 38–126)
Anion gap: 10 (ref 5–15)
BUN: <5 mg/dL — ABNORMAL LOW (ref 6–20)
CO2: 22 mmol/L (ref 22–32)
Calcium: 9 mg/dL (ref 8.9–10.3)
Chloride: 105 mmol/L (ref 98–111)
Creatinine, Ser: 0.57 mg/dL (ref 0.44–1.00)
GFR calc Af Amer: >60 mL/min (ref >60)
GFR calc non Af Amer: >60 mL/min (ref >60)
Glucose, Bld: 109 mg/dL — ABNORMAL HIGH (ref 70–99)
Potassium: 3.3 mmol/L — ABNORMAL LOW (ref 3.5–5.1)
Sodium: 137 mmol/L (ref 135–145)
Total Bilirubin: 0.6 mg/dL (ref 0.3–1.2)
Total Protein: 6 g/dL — ABNORMAL LOW (ref 6.5–8.1)

## 2019-04-04 LAB — TYPE AND SCREEN
ABO/RH(D): B POS
Antibody Screen: NEGATIVE

## 2019-04-04 LAB — PROTEIN / CREATININE RATIO, URINE
Creatinine, Urine: 199.37 mg/dL
Protein Creatinine Ratio: 0.2 mg/mg{Cre} — ABNORMAL HIGH (ref 0.00–0.15)
Total Protein, Urine: 39 mg/dL

## 2019-04-04 LAB — AMNISURE RUPTURE OF MEMBRANE (ROM) NOT AT ARMC: Amnisure ROM: POSITIVE

## 2019-04-04 LAB — ABO/RH: ABO/RH(D): B POS

## 2019-04-04 LAB — SARS CORONAVIRUS 2 BY RT PCR (HOSPITAL ORDER, PERFORMED IN ~~LOC~~ HOSPITAL LAB): SARS Coronavirus 2: NEGATIVE

## 2019-04-04 MED ORDER — DOCUSATE SODIUM 100 MG PO CAPS: 100.0000 mg | ORAL_CAPSULE | Freq: Every day | ORAL | Status: DC

## 2019-04-04 MED ORDER — LACTATED RINGERS IV SOLN
INTRAVENOUS | Status: DC
  Administered 2019-04-04 (×2): via INTRAVENOUS

## 2019-04-04 MED ORDER — CALCIUM CARBONATE ANTACID 500 MG PO CHEW: 2.0000 | CHEWABLE_TABLET | ORAL | Status: DC | PRN

## 2019-04-04 MED ORDER — ACETAMINOPHEN 325 MG PO TABS
650.0000 mg | ORAL_TABLET | ORAL | Status: DC | PRN
  Administered 2019-04-05: 650 mg via ORAL
  Filled 2019-04-04: qty 2

## 2019-04-04 MED ORDER — PRENATAL MULTIVITAMIN CH: 1.0000 | ORAL_TABLET | Freq: Every day | ORAL | Status: DC

## 2019-04-04 MED ORDER — SODIUM CHLORIDE 0.9 % IV SOLN
500.0000 mg | Freq: Once | INTRAVENOUS | Status: AC
  Administered 2019-04-04: 500 mg via INTRAVENOUS
  Filled 2019-04-04: qty 500

## 2019-04-04 MED ORDER — BETAMETHASONE SOD PHOS & ACET 6 (3-3) MG/ML IJ SUSP
12.0000 mg | INTRAMUSCULAR | Status: DC
  Administered 2019-04-04: 12 mg via INTRAMUSCULAR
  Filled 2019-04-04: qty 2

## 2019-04-04 MED ORDER — SODIUM CHLORIDE 0.9 % IV SOLN
2.0000 g | Freq: Four times a day (QID) | INTRAVENOUS | Status: DC
  Administered 2019-04-04: 21:00:00 2 g via INTRAVENOUS
  Filled 2019-04-04 (×3): qty 2000

## 2019-04-04 NOTE — Progress Notes (Signed)
Pt states she has been having a trickle of clear/odorless fluid from her vagina since approx 0730 on 04-03-19.  First time occurred after urinating but has continued to happen since.  Pt states she has been wearing a peripad.  Good fetal movement. No vaginal bleeding.

## 2019-04-04 NOTE — MAU Note (Signed)
Patient first noticed LOF yesterday 8/2 at 0800, it stopped around 1200 but started again this am.  No bleeding associated with LOF.  Positive FM noted.  Today pt was at ultrasound visit and BP was elevated.  Pt was advised to come to MAU to follow up on BP reading.  No neuro or PIH symptoms.

## 2019-04-04 NOTE — H&P (Signed)
ANTEPARTUM ADMISSION HISTORY AND PHYSICAL  Ms. Metheny is a 33yo [id number] at [redacted]w[redacted]d who presents from MFM clinic because of elevated blood pressures and concern for possible ROM. States yesterday morning she went to the bathroom. Stood up because felt like she was finished and noticed more leaking. Thought it must be urine so went to the bathroom again. She was still having some leaking of clear fluid, so she wore a pad for a few hours yesterday. It stopped around noon. Then it started again today. She states she has had normal fetal movement. Denies any itching, pain, abnormal discharge. No dysuria. She has had a sprite and a gingerale to drink today. Eating like normal.   Has not had elevated blood pressures this pregnancy. Denies headaches, blurry vision, shortness of breath, abdominal pain. States she takes her blood pressures at home and usually around 120/70.   Prenatal History/Complications: . History of PPROM, preterm delivery . Abnormal finding on antenatal U/S . AMA . Obesity   Past Medical History: Past Medical History:  Diagnosis Date  . Febrile seizure ([place])   . Molar pregnancy 2011  . 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 Thyroid disease   . Varicose veins of both lower extremities     Past Surgical History: Past Surgical History:  Procedure Laterality Date  . DILATION AND EVACUATION  2011    Obstetrical History: OB History    Gravida  4   Para  2   Term  1   Preterm  1   AB  1   Living  2     SAB  0   TAB      Ectopic      Multiple  0   Live Births  2           Social History: Social History   Socioeconomic History  . Marital status: Single    Spouse name: Not on file  . Number of children: Not on file  . Years of education: Not on file  . Highest education level: Not on file  Occupational History  . Not on file  Social Needs  . Financial resource strain: Not on file  . Food insecurity     Worry: Not on file    Inability: Not on file  . Transportation needs    Medical: Not on file    Non-medical: Not on file  Tobacco Use  . Smoking status: Never Smoker  . Smokeless tobacco: Never Used  Substance and Sexual Activity  . Alcohol use: Never    Frequency: Never  . Drug use: Never  . Sexual activity: Yes    Birth control/protection: None  Lifestyle  . Physical activity    Days per week: Not on file    Minutes per session: Not on file  . Stress: Not on file  Relationships  . Social Herbalist on phone: Not on file    Gets together: Not on file    Attends religious service: Not on file    Active member of club or organization: Not on file    Attends meetings of clubs or organizations: Not on file    Relationship status: Not on file  Other Topics Concern  . Not on file  Social History Narrative  . Not on file    Family History: Family History  Problem Relation Age of Onset  . Stroke Father   . Stroke Brother  Allergies: No Known Allergies  Medications Prior to Admission  Medication Sig Dispense Refill Last Dose  . aspirin EC 81 MG tablet Take 1 tablet (81 mg total) by mouth daily. 60 tablet 3   . Prenatal Vit-Fe Fumarate-FA (PRENATAL VITAMIN PLUS LOW IRON) 27-1 MG TABS Take 1 tablet by mouth daily. 90 tablet 3   . Prenatal Vit-Fe Fumarate-FA (PREPLUS) 27-1 MG TABS Take 1 tablet by mouth daily. 30 tablet 13      Review of Systems  All systems reviewed and negative except as stated in HPI  Blood pressure (!) 142/68, pulse (!) 109, temperature 98.3 F (36.8 C), temperature source Oral, resp. rate 19, last menstrual period 08/11/2018, unknown if currently breastfeeding. Nursing note and vitals reviewed. Constitutional: She is oriented to person, place, and time. She appears well-developed and well-nourished. No distress.  HENT:  Head: Normocephalic and atraumatic.  Mouth/Throat: Oropharynx is clear and moist.  Eyes: Pupils are equal, round,  and reactive to light. EOM are normal. No scleral icterus.  Neck: Neck supple. No thyromegaly present.  Cardiovascular: Normal rate.  Respiratory: Effort normal. No respiratory distress.  GI: Soft. There is no abdominal tenderness.  gravid  Genitourinary:    Genitourinary Comments: Normal-appearing external genitalia and vaginal mucosa  no obvious pooling, cervix not visualized  no CMT  1/50/-3 - unable to determine fetal presentation but feels to be intact    Musculoskeletal:        General: Edema (trace b/l LE edema ) present.  Lymphadenopathy:    She has no cervical adenopathy.  Neurological: She is alert and oriented to person, place, and time. She has normal reflexes. No cranial nerve deficit.  Psychiatric: She has a normal mood and affect. Her behavior is normal.  Presentation: cephalic Fetal monitoring: 150s/mod/+a/-d Uterine activity: irritability  Dilation: 1 Effacement (%): 50 Station: Ballotable Exam by:: Earlene PlaterWallace DO  Prenatal labs: ABO, Rh: B/Positive/-- (02/24 1518) Antibody: Negative (02/24 1518) Rubella: 1.22 (02/24 1518) RPR: Non Reactive (06/30 0849)  HBsAg: Negative (02/24 1518)  HIV: Non Reactive (06/30 0849)  GBS:   pending Glucola: normal 2-hr GTT Genetic screening:  Low risk NIPS  Prenatal Transfer Tool  Maternal Diabetes: No Genetic Screening: Normal Maternal Ultrasounds/Referrals: possible fetal VSD but reported normal fetal ECHO  Fetal Ultrasounds or other Referrals:  Fetal echo Maternal Substance Abuse:  No Significant Maternal Medications:  ASA  PNV Significant Maternal Lab Results: None  Results for orders placed or performed during the hospital encounter of 04/04/19 (from the past 24 hour(s))  [facility]   Collection Time: 04/04/19  6:07 PM  Result Value Ref Range   Amnisure ROM POSITIVE     Patient Active Problem List   Diagnosis Date Noted  . Preterm premature rupture of membranes (PPROM) with  unknown onset of labor 04/04/2019  . Supervision of high risk pregnancy, antepartum 10/25/2018  . History of molar pregnancy 10/25/2018  . BMI 40.0-44.9, adult ([facility]) 10/25/2018  . Obesity in pregnancy 10/25/2018  . Short interval between pregnancies affecting pregnancy, antepartum 10/25/2018  . AMA (advanced maternal age) multigravida 35+ 10/25/2018  . Abnormal finding on antenatal ultrasound 10/25/2018  . History of preterm delivery, currently pregnant 06/06/2018    Assessment/Plan:  Loney Laurencearis M Werk is a 33 y.o. [id number] at 9544w5d who is admitted to the antepartum unit for PPROM.   PPROM: Equivocal physical exam but decent story for ROM and positive amnisure. Not experiencing contractions at this time. -- latency antibiotics  with ampicillin and azithromycin -- BMZ x 2 - assuming COVID test negative  -- will check GBS culture  -- plan for IOL at 34 weeks   Elevated Blood Pressure: Presumed to be new diagnosis of gestational hypertension versus preeclampsia. Awaiting labs.  -- follow-up CMP, CBC, UPC   Antwaun Buth S. Earlene PlaterWallace, DO OB/GYN Fellow

## 2019-04-04 NOTE — Plan of Care (Signed)
  Problem: Education: Goal: Knowledge of General Education information will improve Description: Including pain rating scale, medication(s)/side effects and non-pharmacologic comfort measures Outcome: Completed/Met

## 2019-04-05 ENCOUNTER — Inpatient Hospital Stay (HOSPITAL_COMMUNITY): Payer: Medicaid Other | Admitting: Anesthesiology

## 2019-04-05 ENCOUNTER — Ambulatory Visit: Payer: Medicaid Other

## 2019-04-05 ENCOUNTER — Encounter (HOSPITAL_COMMUNITY): Payer: Self-pay

## 2019-04-05 ENCOUNTER — Encounter (HOSPITAL_COMMUNITY)
Admission: AD | Disposition: A | Discharge status: Home / Self Care | Payer: Self-pay | Source: Home / Self Care | Attending: Obstetrics & Gynecology

## 2019-04-05 DIAGNOSIS — O139 Gestational [pregnancy-induced] hypertension without significant proteinuria, unspecified trimester: Secondary | ICD-10-CM

## 2019-04-05 DIAGNOSIS — Z302 Encounter for sterilization: Secondary | ICD-10-CM

## 2019-04-05 DIAGNOSIS — Z3A33 33 weeks gestation of pregnancy: Secondary | ICD-10-CM

## 2019-04-05 DIAGNOSIS — O42013 Preterm premature rupture of membranes, onset of labor within 24 hours of rupture, third trimester: Secondary | ICD-10-CM

## 2019-04-05 HISTORY — PX: TUBAL LIGATION: SHX77

## 2019-04-05 HISTORY — DX: Gestational (pregnancy-induced) hypertension without significant proteinuria, unspecified trimester: O13.9

## 2019-04-05 LAB — CULTURE, BETA STREP (GROUP B ONLY)

## 2019-04-05 LAB — RPR: RPR Ser Ql: NONREACTIVE

## 2019-04-05 SURGERY — LIGATION, FALLOPIAN TUBE, POSTPARTUM
Anesthesia: Spinal | Site: Abdomen | Laterality: Bilateral | Wound class: Clean Contaminated

## 2019-04-05 MED ORDER — LACTATED RINGERS IV SOLN
INTRAVENOUS | Status: DC
  Administered 2019-04-05: 16:00:00 via INTRAVENOUS
  Administered 2019-04-05: 10:00:00 20 mL/h via INTRAVENOUS

## 2019-04-05 MED ORDER — SENNOSIDES-DOCUSATE SODIUM 8.6-50 MG PO TABS
2.0000 | ORAL_TABLET | ORAL | Status: DC
  Administered 2019-04-05 – 2019-04-06 (×2): 2 via ORAL
  Filled 2019-04-05 (×2): qty 2

## 2019-04-05 MED ORDER — ACETAMINOPHEN 325 MG PO TABS
650.0000 mg | ORAL_TABLET | ORAL | Status: DC | PRN
  Administered 2019-04-05: 650 mg via ORAL
  Filled 2019-04-05: qty 2

## 2019-04-05 MED ORDER — OXYTOCIN 40 UNITS IN NORMAL SALINE INFUSION - SIMPLE MED
INTRAVENOUS | Status: AC
  Filled 2019-04-05: qty 1000

## 2019-04-05 MED ORDER — METOCLOPRAMIDE HCL 10 MG PO TABS
10.0000 mg | ORAL_TABLET | Freq: Once | ORAL | Status: AC
  Administered 2019-04-05: 10 mg via ORAL
  Filled 2019-04-05: qty 1

## 2019-04-05 MED ORDER — DIPHENHYDRAMINE HCL 25 MG PO CAPS: 25.0000 mg | ORAL_CAPSULE | Freq: Four times a day (QID) | ORAL | Status: DC | PRN

## 2019-04-05 MED ORDER — WITCH HAZEL-GLYCERIN EX PADS: 1.0000 "application " | MEDICATED_PAD | CUTANEOUS | Status: DC | PRN

## 2019-04-05 MED ORDER — OXYTOCIN 10 UNIT/ML IJ SOLN
INTRAMUSCULAR | Status: AC
  Administered 2019-04-05: 10 [IU]
  Filled 2019-04-05: qty 1

## 2019-04-05 MED ORDER — PROPOFOL 10 MG/ML IV BOLUS
INTRAVENOUS | Status: AC
  Filled 2019-04-05: qty 20

## 2019-04-05 MED ORDER — TETANUS-DIPHTH-ACELL PERTUSSIS 5-2.5-18.5 LF-MCG/0.5 IM SUSP: 0.5000 mL | Freq: Once | INTRAMUSCULAR | Status: DC

## 2019-04-05 MED ORDER — FENTANYL CITRATE (PF) 100 MCG/2ML IJ SOLN
INTRAMUSCULAR | Status: DC | PRN
  Administered 2019-04-05: 100 ug via INTRATHECAL

## 2019-04-05 MED ORDER — FENTANYL CITRATE (PF) 100 MCG/2ML IJ SOLN
INTRAMUSCULAR | Status: DC | PRN
  Administered 2019-04-05: 100 ug via INTRAVENOUS

## 2019-04-05 MED ORDER — ONDANSETRON HCL 4 MG/2ML IJ SOLN: 4.0000 mg | INTRAMUSCULAR | Status: DC | PRN

## 2019-04-05 MED ORDER — BUPIVACAINE HCL (PF) 0.5 % IJ SOLN
INTRAMUSCULAR | Status: AC
  Filled 2019-04-05: qty 30

## 2019-04-05 MED ORDER — ONDANSETRON HCL 4 MG PO TABS: 4.0000 mg | ORAL_TABLET | ORAL | Status: DC | PRN

## 2019-04-05 MED ORDER — SIMETHICONE 80 MG PO CHEW: 80.0000 mg | CHEWABLE_TABLET | ORAL | Status: DC | PRN

## 2019-04-05 MED ORDER — DEXAMETHASONE SODIUM PHOSPHATE 10 MG/ML IJ SOLN
INTRAMUSCULAR | Status: AC
  Filled 2019-04-05: qty 1

## 2019-04-05 MED ORDER — ONDANSETRON HCL 4 MG/2ML IJ SOLN
INTRAMUSCULAR | Status: AC
  Filled 2019-04-05: qty 2

## 2019-04-05 MED ORDER — BUPIVACAINE IN DEXTROSE 0.75-8.25 % IT SOLN
INTRATHECAL | Status: AC
  Filled 2019-04-05: qty 2

## 2019-04-05 MED ORDER — PRENATAL MULTIVITAMIN CH
1.0000 | ORAL_TABLET | Freq: Every day | ORAL | Status: DC
  Administered 2019-04-06: 1 via ORAL
  Filled 2019-04-05: qty 1

## 2019-04-05 MED ORDER — IBUPROFEN 600 MG PO TABS
600.0000 mg | ORAL_TABLET | Freq: Four times a day (QID) | ORAL | Status: DC
  Administered 2019-04-05 – 2019-04-07 (×8): 600 mg via ORAL
  Filled 2019-04-05 (×8): qty 1

## 2019-04-05 MED ORDER — COCONUT OIL OIL: 1.0000 "application " | TOPICAL_OIL | Status: DC | PRN

## 2019-04-05 MED ORDER — BUPIVACAINE IN DEXTROSE 0.75-8.25 % IT SOLN
INTRATHECAL | Status: DC | PRN
  Administered 2019-04-05 (×2): 1.6 mL via INTRATHECAL

## 2019-04-05 MED ORDER — BUPIVACAINE HCL (PF) 0.5 % IJ SOLN
INTRAMUSCULAR | Status: DC | PRN
  Administered 2019-04-05: 30 mL

## 2019-04-05 MED ORDER — MIDAZOLAM HCL 2 MG/2ML IJ SOLN
INTRAMUSCULAR | Status: AC
  Filled 2019-04-05: qty 2

## 2019-04-05 MED ORDER — SODIUM CHLORIDE 0.9 % IR SOLN
Status: DC | PRN
  Administered 2019-04-05: 1000 mL

## 2019-04-05 MED ORDER — OXYCODONE-ACETAMINOPHEN 5-325 MG PO TABS
1.0000 | ORAL_TABLET | ORAL | Status: DC | PRN
  Administered 2019-04-05 – 2019-04-07 (×6): 1 via ORAL
  Filled 2019-04-05 (×6): qty 1

## 2019-04-05 MED ORDER — FENTANYL CITRATE (PF) 100 MCG/2ML IJ SOLN: 25.0000 ug | INTRAMUSCULAR | Status: DC | PRN

## 2019-04-05 MED ORDER — ONDANSETRON HCL 4 MG/2ML IJ SOLN
INTRAMUSCULAR | Status: DC | PRN
  Administered 2019-04-05: 4 mg via INTRAVENOUS

## 2019-04-05 MED ORDER — DEXAMETHASONE SODIUM PHOSPHATE 10 MG/ML IJ SOLN
INTRAMUSCULAR | Status: DC | PRN
  Administered 2019-04-05: 10 mg via INTRAVENOUS

## 2019-04-05 MED ORDER — BENZOCAINE-MENTHOL 20-0.5 % EX AERO
1.0000 "application " | INHALATION_SPRAY | CUTANEOUS | Status: DC | PRN
  Administered 2019-04-05: 1 via TOPICAL
  Filled 2019-04-05: qty 56

## 2019-04-05 MED ORDER — FAMOTIDINE 20 MG PO TABS
40.0000 mg | ORAL_TABLET | Freq: Once | ORAL | Status: AC
  Administered 2019-04-05: 40 mg via ORAL
  Filled 2019-04-05: qty 2

## 2019-04-05 MED ORDER — FENTANYL CITRATE (PF) 100 MCG/2ML IJ SOLN
INTRAMUSCULAR | Status: AC
  Filled 2019-04-05: qty 2

## 2019-04-05 MED ORDER — MIDAZOLAM HCL 2 MG/2ML IJ SOLN
INTRAMUSCULAR | Status: DC | PRN
  Administered 2019-04-05: 2 mg via INTRAVENOUS

## 2019-04-05 MED ORDER — DIBUCAINE (PERIANAL) 1 % EX OINT: 1.0000 "application " | TOPICAL_OINTMENT | CUTANEOUS | Status: DC | PRN

## 2019-04-05 SURGICAL SUPPLY — 21 items
CLIP FILSHIE TUBAL LIGA STRL (Clip) ×2 IMPLANT
CLOSURE WOUND 1/4 X3 (GAUZE/BANDAGES/DRESSINGS) ×1
DRSG OPSITE POSTOP 3X4 (GAUZE/BANDAGES/DRESSINGS) ×3 IMPLANT
DURAPREP 26ML APPLICATOR (WOUND CARE) ×4 IMPLANT
GLOVE BIO SURGEON STRL SZ 6.5 (GLOVE) ×2 IMPLANT
GLOVE BIO SURGEONS STRL SZ 6.5 (GLOVE) ×1
GLOVE BIOGEL PI IND STRL 7.0 (GLOVE) ×1 IMPLANT
GLOVE BIOGEL PI INDICATOR 7.0 (GLOVE) ×2
GOWN STRL REUS W/ TWL LRG LVL3 (GOWN DISPOSABLE) ×2 IMPLANT
GOWN STRL REUS W/TWL LRG LVL3 (GOWN DISPOSABLE) ×6
HIBICLENS CHG 4% 4OZ BTL (MISCELLANEOUS) ×3 IMPLANT
NS IRRIG 1000ML POUR BTL (IV SOLUTION) ×3 IMPLANT
PACK ABDOMINAL MINOR (CUSTOM PROCEDURE TRAY) ×3 IMPLANT
PROTECTOR NERVE ULNAR (MISCELLANEOUS) ×3 IMPLANT
STRIP CLOSURE SKIN 1/4X3 (GAUZE/BANDAGES/DRESSINGS) ×2 IMPLANT
SUT CHROMIC 0 CT 1 (SUTURE) ×2 IMPLANT
SUT VIC AB 0 CT1 27 (SUTURE) ×3
SUT VIC AB 0 CT1 27XBRD ANBCTR (SUTURE) ×1 IMPLANT
SUT VICRYL 4-0 PS2 18IN ABS (SUTURE) ×3 IMPLANT
TOWEL OR 17X24 6PK STRL BLUE (TOWEL DISPOSABLE) ×6 IMPLANT
TRAY FOLEY W/BAG SLVR 14FR (SET/KITS/TRAYS/PACK) ×3 IMPLANT

## 2019-04-05 NOTE — Progress Notes (Signed)
Unsuccessful IV placement, one on left hand and one on R hand with 20 gauge cath. Patient tolerated procedure well.

## 2019-04-05 NOTE — Op Note (Signed)
04/04/2019 - 04/05/2019  4:13 PM  PATIENT:  Allison Cameron  33 y.o. female  PRE-OPERATIVE DIAGNOSIS:  Unwanted fertility  POST-OPERATIVE DIAGNOSIS:  Unwanted fertility  PROCEDURE:  Procedure(s): POST PARTUM TUBAL LIGATION (Bilateral)  SURGEON:  Surgeon(s) and Role:    * Surie Suchocki, Wilhemina Cash, MD - Primary   ASSISTANTS: Gerri Lins, MD   ANESTHESIA:   local and spinal  EBL:  minimal  BLOOD ADMINISTERED:none  DRAINS: none   LOCAL MEDICATIONS USED:  MARCAINE     SPECIMEN:  No Specimen  DISPOSITION OF SPECIMEN:  N/A  COUNTS:  YES  TOURNIQUET:  * No tourniquets in log *  DICTATION: .Dragon Dictation  PLAN OF CARE: Admit to inpatient   PATIENT DISPOSITION:  PACU - hemodynamically stable.   Delay start of Pharmacological VTE agent (>24hrs) due to surgical blood loss or risk of bleeding: not applicable   The risks, benefits, alternatives of surgery were explained understood, accepted. All questions were answered. She understands the failure rate of 1/400. She declines alternative forms of birth control. She was taken to the operating room and her spinal anesthesia was given without complication. Her abdomen was prepped and draped in the usual sterile fashion. A time out procedure was done. Adequate anesthesia was assured. A vertical umbilical incision was made after injecting 10 cc of 0.5% marcaine. The fascia was elevated with Kocher clamps and incised. The incision was extended and the peritoneum was entered with hemostats. The oviducts were each visualized and traced to the fimbriated end. A Filsche clip was placed in the isthmic region across the entirety of each tube. There was no bleeding. The umbilical fascia was closed with a figure of 0 Vicryl suture. No defects were palpable. The subcuticular closure was done with 4-0 Vicryl suture. She tolerated the procedure well. The instrument, sponge, and needle counts were correct.

## 2019-04-05 NOTE — Transfer of Care (Signed)
Immediate Anesthesia Transfer of Care Note  Patient: Allison Cameron  Procedure(s) Performed: POST PARTUM TUBAL LIGATION (Bilateral Abdomen)  Patient Location: PACU  Anesthesia Type:Spinal  Level of Consciousness: awake, alert  and oriented  Airway & Oxygen Therapy: Patient Spontanous Breathing  Post-op Assessment: Report given to RN and Post -op Vital signs reviewed and stable  Post vital signs: Reviewed and stable  Last Vitals:  Vitals Value Taken Time  BP    Temp    Pulse 84 04/05/19 1639  Resp    SpO2 94 % 04/05/19 1639  Vitals shown include unvalidated device data.  Last Pain:  Vitals:   04/05/19 1317  TempSrc: Oral  PainSc: Asleep      Patients Stated Pain Goal: 3 (38/18/29 9371)  Complications: No apparent anesthesia complications

## 2019-04-05 NOTE — Anesthesia Postprocedure Evaluation (Signed)
Anesthesia Post Note  Patient: Allison Cameron  Procedure(s) Performed: POST PARTUM TUBAL LIGATION (Bilateral Abdomen)     Patient location during evaluation: PACU Anesthesia Type: Spinal Level of consciousness: oriented and awake and alert Pain management: pain level controlled Vital Signs Assessment: post-procedure vital signs reviewed and stable Respiratory status: spontaneous breathing, respiratory function stable and patient connected to nasal cannula oxygen Cardiovascular status: blood pressure returned to baseline and stable Postop Assessment: no headache, no backache and no apparent nausea or vomiting Anesthetic complications: no    Last Vitals:  Vitals:   04/05/19 1830 04/05/19 1844  BP: 124/84 128/81  Pulse: 72 69  Resp: 18 18  Temp:  36.5 C  SpO2: 100% 100%    Last Pain:  Vitals:   04/05/19 1844  TempSrc: Oral  PainSc: 0-No pain   Pain Goal: Patients Stated Pain Goal: 3 (04/05/19 1208)  LLE Motor Response: No movement due to regional block (04/05/19 1830) LLE Sensation: Tingling (04/05/19 1830) RLE Motor Response: Purposeful movement (04/05/19 1830) RLE Sensation: Tingling (04/05/19 1830)     Epidural/Spinal Function Cutaneous sensation: Able to Wiggle Toes (04/05/19 1844), Patient able to flex knees: No (04/05/19 1844), Patient able to lift hips off bed: No (04/05/19 1844), Back pain beyond tenderness at insertion site: No (04/05/19 1844), Progressively worsening motor and/or sensory loss: No (04/05/19 1844), Bowel and/or bladder incontinence post epidural: No (04/05/19 1844)  [place]

## 2019-04-05 NOTE — Anesthesia Procedure Notes (Signed)
Spinal  Patient location during procedure: OR Start time: 04/05/2019 3:25 PM End time: 04/05/2019 3:45 PM Staffing Anesthesiologist: Freddrick March, MD Performed: anesthesiologist  Preanesthetic Checklist Completed: patient identified, surgical consent, pre-op evaluation, timeout performed, IV checked, risks and benefits discussed and monitors and equipment checked Spinal Block Patient position: sitting Prep: site prepped and draped and DuraPrep Patient monitoring: cardiac monitor, continuous pulse ox and blood pressure Approach: midline Location: L3-4 Injection technique: single-shot Needle Needle type: Pencan and Tuohy  Needle gauge: 24 G Needle length: 9 cm Assessment Sensory level: T6 Additional Notes Functioning IV was confirmed and monitors were applied. Sterile prep and drape, including hand hygiene and sterile gloves were used. The patient was positioned and the spine was prepped. The skin was anesthetized with lidocaine.  Free flow of clear CSF was obtained prior to injecting local anesthetic into the CSF.  The spinal needle aspirated freely following injection.  The needle was carefully withdrawn.  The patient tolerated the procedure well.

## 2019-04-05 NOTE — Progress Notes (Signed)
Calls to [facility] ([facility]) and NICU charge to notify of imminent delivery and pt's transfer to L/D

## 2019-04-05 NOTE — Progress Notes (Signed)
Patient arrived to L&D in active labor with urge to push. NICU team was contacted and OB Provider was contacted, baby was deliver in less than 2 minutes of arrival to room .  IV was lost during transport, and access was attempted but not successful. Patient is stable after delivery.

## 2019-04-05 NOTE — Anesthesia Preprocedure Evaluation (Signed)
Anesthesia Evaluation  Patient identified by MRN, date of birth, ID band Patient awake    Reviewed: Allergy & Precautions, NPO status , Patient's Chart, lab work & pertinent test results  Airway Mallampati: III  TM Distance: >3 FB Neck ROM: Full    Dental no notable dental hx.    Pulmonary neg pulmonary ROS,    Pulmonary exam normal breath sounds clear to auscultation       Cardiovascular negative cardio ROS Normal cardiovascular exam Rhythm:Regular Rate:Normal     Neuro/Psych Seizures -, Well Controlled,  negative psych ROS   GI/Hepatic negative GI ROS, Neg liver ROS,   Endo/Other  Morbid obesity  Renal/GU negative Renal ROS  negative genitourinary   Musculoskeletal negative musculoskeletal ROS (+)   Abdominal   Peds  Hematology  (+) Blood dyscrasia (Hgb 10.9), anemia ,   Anesthesia Other Findings PPTL  Reproductive/Obstetrics                           Anesthesia Physical Anesthesia Plan  ASA: III  Anesthesia Plan: Spinal   Post-op Pain Management:    Induction:   PONV Risk Score and Plan: Treatment may vary due to age or medical condition  Airway Management Planned: Natural Airway  Additional Equipment:   Intra-op Plan:   Post-operative Plan:   Informed Consent: I have reviewed the patients History and Physical, chart, labs and discussed the procedure including the risks, benefits and alternatives for the proposed anesthesia with the patient or authorized representative who has indicated his/her understanding and acceptance.     Dental advisory given  Plan Discussed with: CRNA  Anesthesia Plan Comments:         Anesthesia Quick Evaluation

## 2019-04-06 NOTE — Progress Notes (Signed)
Patient screened out for psychosocial assessment since none of the following apply:  Psychosocial stressors documented in mother or baby's chart  Gestation less than 32 weeks  Code at delivery   Infant with anomalies Please contact the Clinical Social Worker if specific needs arise, by MOB's request, or if MOB scores greater than 9/yes to question 10 on Edinburgh Postpartum Depression Screen.  Asif Muchow, LCSW Clinical Social Worker [facility] Cell#: [phone]     

## 2019-04-06 NOTE — Progress Notes (Signed)
Post Partum Day 1 Subjective: up ad lib, voiding, tolerating PO and Has pain just to right of incision  Objective: Blood pressure 120/77, pulse 88, temperature 97.8 F (36.6 C), temperature source Oral, resp. rate 20, height 5\' 5"  (1.651 m), weight 117.5 kg, last menstrual period 08/11/2018, SpO2 100 %, unknown if currently breastfeeding.  Physical Exam:  General: alert, cooperative and no distress Lochia: appropriate Uterine Fundus: firm Incision: no significant drainage, dark dried blood on bandage DVT Evaluation: No evidence of DVT seen on physical exam.  Recent Labs    04/04/19 1906  HGB 10.9*  HCT 32.6*    Assessment/Plan: Plan for discharge tomorrow, Breastfeeding and Lactation consult   LOS: 2 days   Hansel Feinstein 04/06/2019, 4:36 AM

## 2019-04-06 NOTE — Progress Notes (Signed)
Coconut oil and size 62mm flanges given to patient to prevent nipple irritaton with pumping

## 2019-04-06 NOTE — Lactation Note (Signed)
This note was copied from a baby's chart. Lactation Consultation Note  Patient Name: Allison Cameron UXNAT'F Date: 04/06/2019 Reason for consult: Initial assessment;Preterm <34wks;Infant < 6lbs;NICU baby  [place] visited with P3 Mom of preterm infant in the NICU.  Baby 73 hrs old and Mom is recovering in the couplet room.  Mom had a BTL yesterday.   Mom's 2nd baby is 29 months old, and she did breastfeed for a month.  Mom has been pumping using the Symphony DEBP, encouraged every 2-3 hrs on initiation setting until she expresses >20 ml.  Mom has [place], faxed referral to [facility] for pump on discharge tomorrow.  Mom has also called requesting information, and left message with WIC.  Reviewed importance of disassembling pump parts and washing, rinsing, and air drying parts of paper towel.  LC washed the parts for Mom.  Reviewed importance of hand expression and breast massage and STS as much as possible.  NICU booklet given to Mom.  Lactation brochure left in room with Mom.  Mom aware of IP and OP lactation support available to her, and encouraged her to call prn.  Interventions Interventions: Breast feeding basics reviewed;Skin to skin;Breast massage;Hand express;DEBP;Expressed milk  Lactation Tools Discussed/Used Tools: Pump Breast pump type: Double-Electric Breast Pump WIC Program: Yes Pump Review: Setup, frequency, and cleaning;Milk Storage Initiated by:: RN Date initiated:: 04/05/19   Consult Status Consult Status: Follow-up Date: 04/07/19 Follow-up type: [place] 04/06/2019, 1:53 PM

## 2019-04-07 DIAGNOSIS — Z9851 Tubal ligation status: Secondary | ICD-10-CM

## 2019-04-07 MED ORDER — OXYCODONE-ACETAMINOPHEN 5-325 MG PO TABS: 1.0000 | ORAL_TABLET | ORAL | 0 refills | Status: AC | PRN

## 2019-04-07 MED ORDER — IBUPROFEN 600 MG PO TABS: 600.0000 mg | ORAL_TABLET | Freq: Four times a day (QID) | ORAL | 0 refills | Status: AC

## 2019-04-08 ENCOUNTER — Ambulatory Visit: Payer: Self-pay

## 2019-04-08 NOTE — Lactation Note (Addendum)
This note was copied from a baby's chart. Lactation Consultation Note: Follow up to see mother. Mother discharged to home. [facility] services will follow up with mother prn.   Patient Name: Allison Cameron CVELF'Y Date: 04/08/2019 Reason for consult: Follow-up assessment   Maternal Data    Feeding Feeding Type: Donor Breast Milk  LATCH Score                   Interventions    Lactation Tools Discussed/Used     Consult Status Consult Status: PRN    Darla Lesches 04/08/2019, 8:43 AM

## 2019-04-12 ENCOUNTER — Ambulatory Visit (INDEPENDENT_AMBULATORY_CARE_PROVIDER_SITE_OTHER): Payer: Medicaid Other | Admitting: *Deleted

## 2019-04-12 ENCOUNTER — Other Ambulatory Visit: Payer: Self-pay

## 2019-04-12 ENCOUNTER — Encounter: Payer: Medicaid Other | Admitting: Family Medicine

## 2019-04-12 VITALS — BP 133/82 | HR 61

## 2019-04-12 DIAGNOSIS — O133 Gestational [pregnancy-induced] hypertension without significant proteinuria, third trimester: Secondary | ICD-10-CM

## 2019-04-12 NOTE — Progress Notes (Signed)
Patient seen and assessed by nursing staff.  Agree with documentation and plan.  

## 2019-04-12 NOTE — Progress Notes (Signed)
Subjective:  Allison Cameron is a 33 y.o. female here for BP check.   Hypertension ROS: taking medications as instructed, no medication side effects noted, no TIA's, no chest pain on exertion, no dyspnea on exertion and no swelling of ankles.    Objective:  LMP 08/11/2018   Appearance alert, well appearing, and in no distress. General exam BP noted to be well controlled today in office.    Assessment:   Blood Pressure well controlled.   Plan:  Follow up: 3 weeks and as needed.Marland Kitchen

## 2019-04-15 ENCOUNTER — Ambulatory Visit: Payer: Self-pay

## 2019-04-15 NOTE — Lactation Note (Signed)
This note was copied from a baby's chart. Lactation Consultation Note  Patient Name: Allison Cameron WOEHO'Z Date: 04/15/2019   LC came to the NICU at 5 pm for a scheduled feeding per mom's request but mom didn't show up. NICU RN told LC that baby has already been fed at 4 pm. LC let her RN know that lactation came for feeding assist in case she wants to reschedule for tomorrow.  Maternal Data    Feeding Feeding Type: Formula Nipple Type: Slow - flow  LATCH Score                   Interventions    Lactation Tools Discussed/Used     Consult Status      [name] 04/15/2019, 5:09 PM

## 2019-04-15 NOTE — Lactation Note (Signed)
This note was copied from a baby's chart. Lactation Consultation Note  Patient Name: Allison Cameron KRCVK'F Date: 04/15/2019   P33, Baby 91 days old.  Mother has 47 mos old at home and older child and is going back and forth between home and hospital. Mother holding baby upon entering after feeding. She is trying to get in 4 pumping sessions per day and is pumping 40-45 ml. Encouraged her to consider bringing pump parts in and pumping at bedside to help her increase her pumping session so her supply will increase.   Mother states is getting ready to go home and will be back by 5 pm this afternoon and would like to try latching baby since she thinks baby may go home tomorrow. Encouraged mother to bring symphony pump parts to hospital to pump at bedside while visiting.       Maternal Data    Feeding Feeding Type: Formula Nipple Type: Slow - flow  LATCH Score                   Interventions    Lactation Tools Discussed/Used     Consult Status      Carlye Grippe 04/15/2019, 10:05 AM

## 2019-04-28 NOTE — Discharge Summary (Signed)
Postpartum Discharge Summary     Patient Name: Allison Cameron DOB: August 16, 1986 MRN: [id number]  Date of admission: 04/04/2019 Delivering Provider: Arrie Senate   Date of discharge: 04/28/2019  Admitting diagnosis: leaking fluids, high bp Intrauterine pregnancy: [redacted]w[redacted]d     Secondary diagnosis:  Principal Problem:   Preterm premature rupture of membranes (PPROM) with unknown onset of labor Active Problems:   History of preterm delivery, currently pregnant   BMI 40.0-44.9, adult ([place])   Obesity in pregnancy   Short interval between pregnancies affecting pregnancy, antepartum   AMA (advanced maternal age) multigravida 35+   Abnormal finding on antenatal ultrasound   Gestational hypertension   Tubal ligation status  Additional problems: none     Discharge diagnosis: Preterm Pregnancy Delivered                                                                                                Post partum procedures:none  Augmentation: none  Complications: None  Hospital course:  Onset of Labor With Vaginal Delivery     33 y.o. yo [id number] at [redacted]w[redacted]d was admitted in Latent Labor on 04/04/2019. Patient had an uncomplicated labor course as follows:  Membrane Rupture Time/Date:  ,04/03/2019   Intrapartum Procedures: Episiotomy: None [1]                                         Lacerations:  None [1]  Patient had a delivery of a Viable infant. 04/05/2019  Information for the patient's newborn:  [facility] [[id number]]  Delivery Method: Vaginal, Spontaneous(Filed from Delivery Summary)     Pateint had an uncomplicated postpartum course.  She is ambulating, tolerating a regular diet, passing flatus, and urinating well. Patient is discharged home in stable condition on 04/28/19.   Magnesium Sulfate recieved: No BMZ received: Yes  Physical exam  Vitals:   04/06/19 1140 04/06/19 1630 04/06/19 2307 04/07/19 0513  BP: 125/84 132/79 132/86 127/82  Pulse: 79 79 73 75  Resp: 20  18 16 16   Temp: 97.9 F (36.6 C) 97.9 F (36.6 C) 97.8 F (36.6 C) 97.8 F (36.6 C)  TempSrc: Oral Axillary Oral Oral  SpO2: 100% 100%    Weight:      Height:       General: alert, cooperative and no distress Lochia: appropriate Uterine Fundus: firm Incision: N/A DVT Evaluation: No evidence of DVT seen on physical exam. Labs: Lab Results  Component Value Date   WBC 8.9 04/04/2019   HGB 10.9 (L) 04/04/2019   HCT 32.6 (L) 04/04/2019   MCV 89.8 04/04/2019   PLT 284 04/04/2019   CMP Latest Ref Rng & Units 04/04/2019  Glucose 70 - 99 mg/dL 109(H)  BUN 6 - 20 mg/dL <5(L)  Creatinine 0.44 - 1.00 mg/dL 0.57  Sodium 135 - 145 mmol/L 137  Potassium 3.5 - 5.1 mmol/L 3.3(L)  Chloride 98 - 111 mmol/L 105  CO2 22 - 32 mmol/L 22  Calcium 8.9 - 10.3 mg/dL 9.0  Total Protein 6.5 - 8.1 g/dL 6.0(L)  Total Bilirubin 0.3 - 1.2 mg/dL 0.6  Alkaline Phos 38 - 126 U/L 120  AST 15 - 41 U/L 19  ALT 0 - 44 U/L 17    Discharge instruction: per After Visit Summary and "Baby and Me Booklet".  After visit meds:  Allergies as of 04/07/2019   No Known Allergies     Medication List    TAKE these medications   aspirin EC 81 MG tablet Take 1 tablet (81 mg total) by mouth daily.   ibuprofen 600 MG tablet Commonly known as: ADVIL Take 1 tablet (600 mg total) by mouth every 6 (six) hours.   oxyCODONE-acetaminophen 5-325 MG tablet Commonly known as: PERCOCET/ROXICET Take 1 tablet by mouth every 4 (four) hours as needed for moderate pain.   Prenatal Vitamin Plus Low Iron 27-1 MG Tabs Take 1 tablet by mouth daily.   PrePLUS 27-1 MG Tabs Take 1 tablet by mouth daily.       Diet: routine diet  Activity: Advance as tolerated. Pelvic rest for 6 weeks.   Outpatient follow up:2 weeks Follow up Appt: Future Appointments  Date Time Provider Department Center  05/03/2019  2:30 PM Allie Bossierove, Myra C, MD [facility]   Follow up Visit: Follow-up Information    [facility]  [facility]. Schedule an appointment as soon as possible for a visit in 1 week(s).   Specialty: Obstetrics and Gynecology Contact information: [address] [place] [id number] [phone]           Please schedule this patient for Postpartum visit in: 1 week with the following provider: Any provider For C/S patients schedule nurse incision check in weeks 2 weeks:yes High risk pregnancy complicated by: HTN Delivery mode:  SVD Anticipated Birth Control:  BTL done PP PP Procedures needed: BP check  Schedule Integrated BH visit: no      Newborn Data: Live born female  Birth Weight: 5 lb 13.1 oz (2640 g) APGAR: 7, 9  Newborn Delivery   Birth date/time: 04/05/2019 02:01:00 Delivery type: Vaginal, Spontaneous      Baby Feeding: Breast Disposition:NICU   04/28/2019 Wynelle BourgeoisMarie Teesha Ohm, CNM

## 2019-05-03 ENCOUNTER — Ambulatory Visit: Payer: Medicaid Other | Admitting: Obstetrics & Gynecology

## 2019-05-19 ENCOUNTER — Encounter: Payer: Self-pay | Admitting: Obstetrics and Gynecology

## 2019-05-19 ENCOUNTER — Other Ambulatory Visit: Payer: Self-pay

## 2019-05-19 ENCOUNTER — Ambulatory Visit (INDEPENDENT_AMBULATORY_CARE_PROVIDER_SITE_OTHER): Payer: Medicaid Other | Admitting: Obstetrics and Gynecology

## 2019-05-19 NOTE — Progress Notes (Signed)
Obstetrics and Gynecology Postpartum Visit  Appointment Date: 05/19/2019  OBGYN Clinic: Center for [facility]  Primary Care Provider: Patient, No Pcp Per  Chief Complaint:  Chief Complaint  Patient presents with  . Postpartum Care    History of Present Illness: Allison Cameron is a 33 y.o. African-American [id number] (Patient's last menstrual period was 08/11/2018.), seen for the above chief complaint. Her past medical history is significant for nothing   She is s/p SVD/intact perineum on 8/4; she was discharged to home on PPD#2  Vaginal bleeding or discharge: patient had period earlier this month and then has some slight bleeding since then  Breast or formula feeding: stopped breast 2 weeks ago Intercourse: No  Contraception after delivery: bilateral tubal ligation PP depression s/s: No  Any bowel or bladder issues: No  Pap smear: no abnormalities (date: 2019 at [facility])  Review of Systems: as noted in the History of Present Illness.  Patient Active Problem List   Diagnosis Date Noted  . Tubal ligation status 04/07/2019  . Gestational hypertension 04/05/2019  . Preterm premature rupture of membranes (PPROM) with unknown onset of labor 04/04/2019  . Supervision of high risk pregnancy, antepartum 10/25/2018  . History of molar pregnancy 10/25/2018  . BMI 40.0-44.9, adult ([place]) 10/25/2018  . Obesity in pregnancy 10/25/2018  . Short interval between pregnancies affecting pregnancy, antepartum 10/25/2018  . AMA (advanced maternal age) multigravida 35+ 10/25/2018  . Abnormal finding on antenatal ultrasound 10/25/2018  . History of preterm delivery, currently pregnant 06/06/2018    Medications Arlet M. Lukens had no medications administered during this visit. Current Outpatient Medications  Medication Sig Dispense Refill  . aspirin EC 81 MG tablet Take 1 tablet (81 mg total) by mouth daily. 60 tablet 3  . ibuprofen (ADVIL) 600 MG tablet Take 1 tablet (600 mg total) by  mouth every 6 (six) hours. 30 tablet 0  . oxyCODONE-acetaminophen (PERCOCET/ROXICET) 5-325 MG tablet Take 1 tablet by mouth every 4 (four) hours as needed for moderate pain. 10 tablet 0  . Prenatal Vit-Fe Fumarate-FA (PRENATAL VITAMIN PLUS LOW IRON) 27-1 MG TABS Take 1 tablet by mouth daily. 90 tablet 3  . Prenatal Vit-Fe Fumarate-FA (PREPLUS) 27-1 MG TABS Take 1 tablet by mouth daily. 30 tablet 13   No current facility-administered medications for this visit.     Allergies Patient has no known allergies.  Physical Exam:  BP 125/79   Pulse 80   Wt 240 lb (108.9 kg)   LMP 08/11/2018   BMI 39.94 kg/m  Body mass index is 39.94 kg/m. General appearance: Well nourished, well developed female in no acute distress.  Neuro/Psych:  Normal mood and affect.   Laboratory: none  PP Depression Screening:  EPDS score 0  Assessment: pt doing well  Plan:  Routine care D/w her menses can be odd for the first few cycles and especially with just stopping breast feeding. She was on continuous OCPs in the past for menorrhagia. I told her there's a possibility that it could come back and to let us know if that's the case.   RTC PRN  Durene Romans MD Attending Center for [employer] [facility])

## 2020-05-16 ENCOUNTER — Other Ambulatory Visit: Payer: Medicaid Other

## 2020-05-16 ENCOUNTER — Other Ambulatory Visit: Payer: Self-pay

## 2020-05-16 DIAGNOSIS — Z20822 Contact with and (suspected) exposure to covid-19: Secondary | ICD-10-CM

## 2020-05-19 LAB — NOVEL CORONAVIRUS, NAA: SARS-CoV-2, NAA: NOT DETECTED

## 2020-07-10 IMAGING — US US MFM OB FOLLOW-UP
1 series · 14 of 28 positions shown · non-contrast
Comparison: none

[Series 1: us mfm ob follow-up · 14 of 39 slices shown]
[im 2/39]
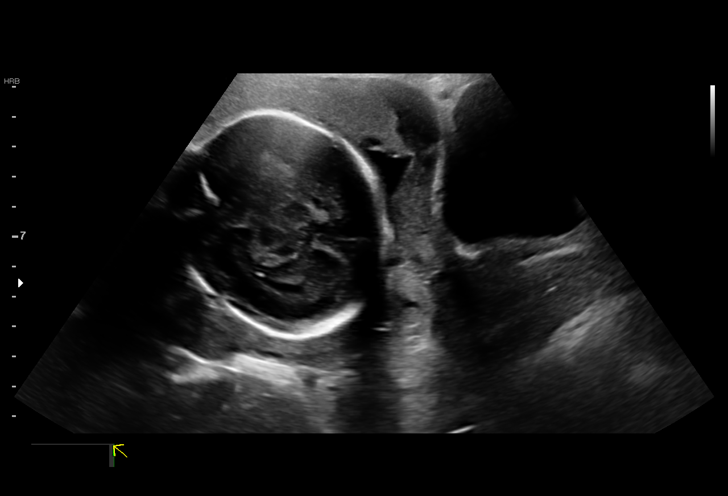
[im 5/39]
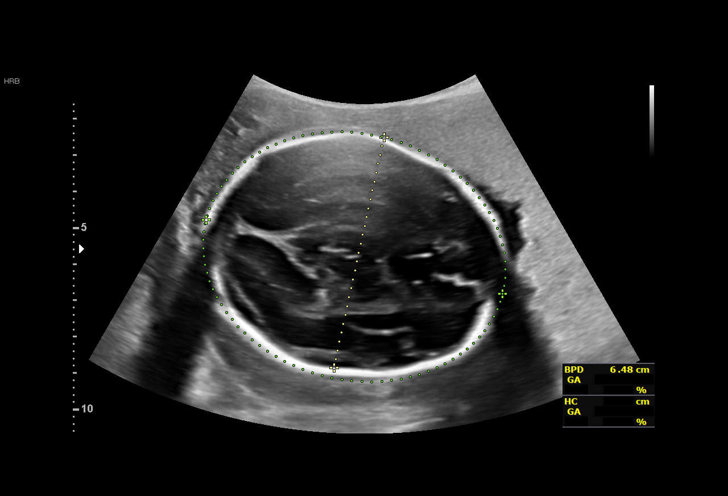
[im 8/39]
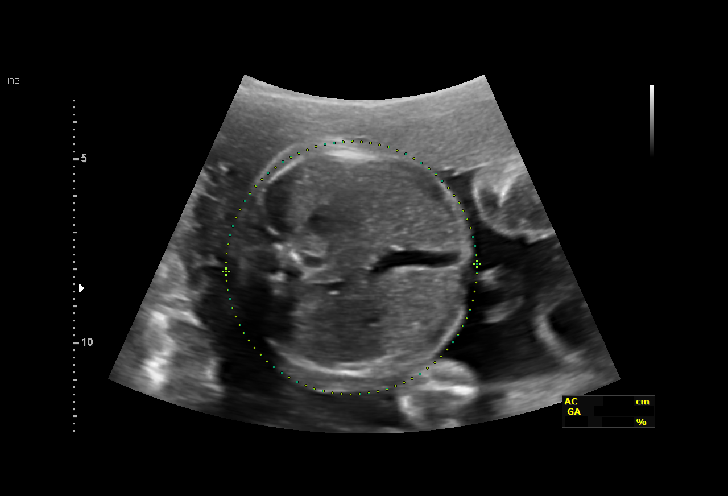
[im 10/39]
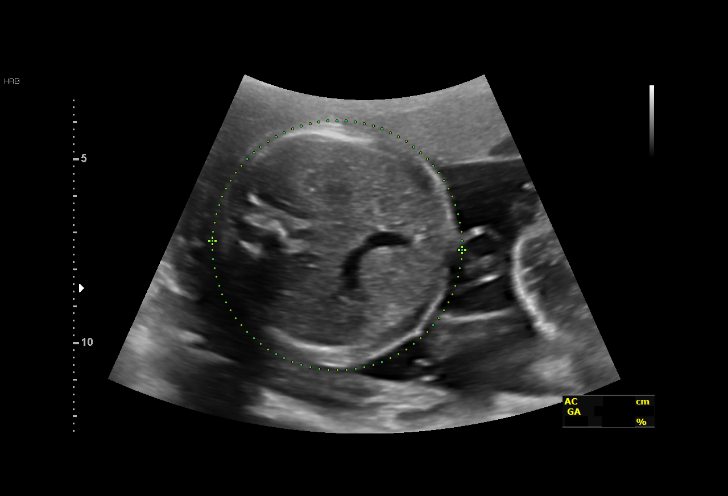
[im 13/39]
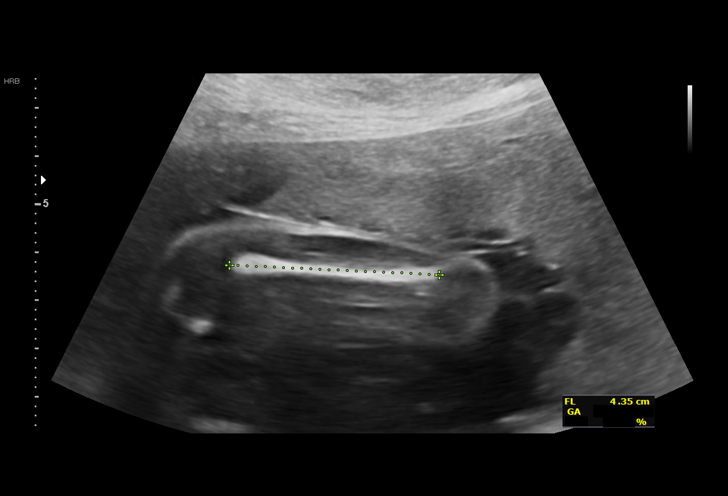
[im 16/39]
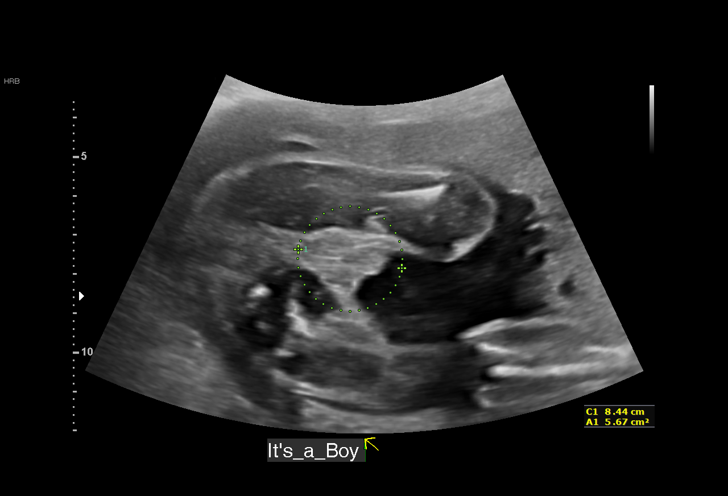
[im 19/39]
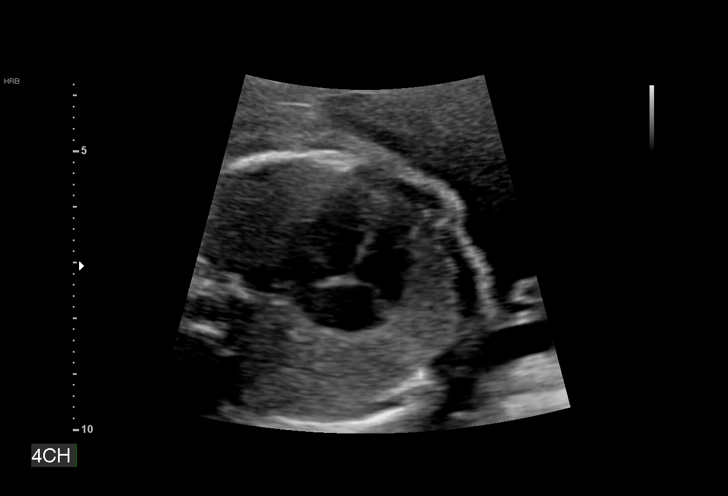
[im 22/39]
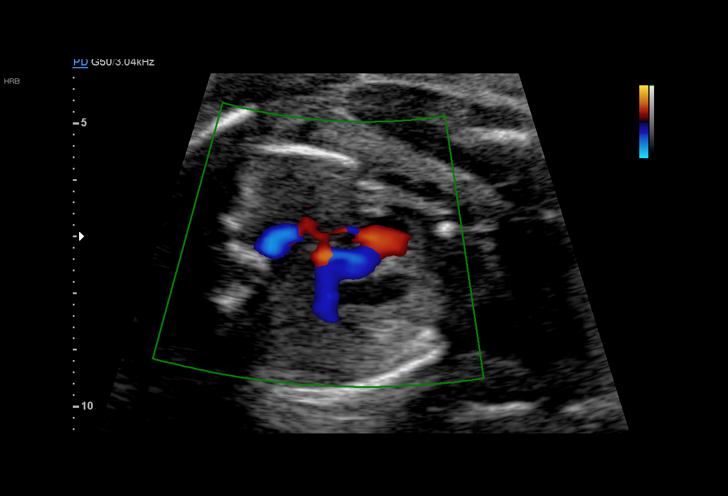
[im 24/39]
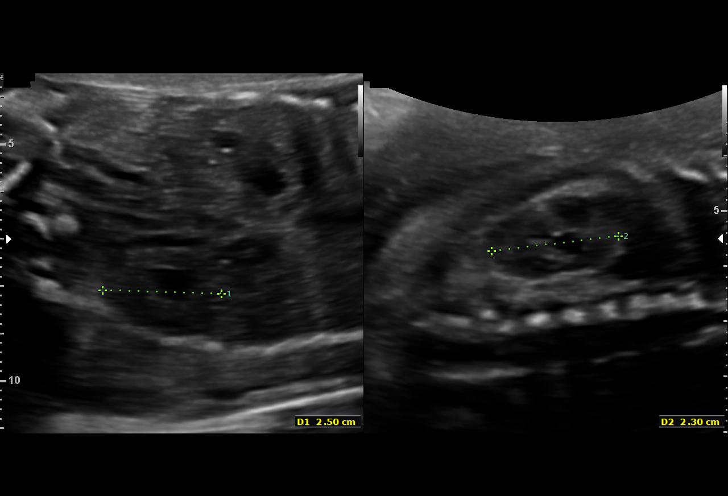
[im 27/39]
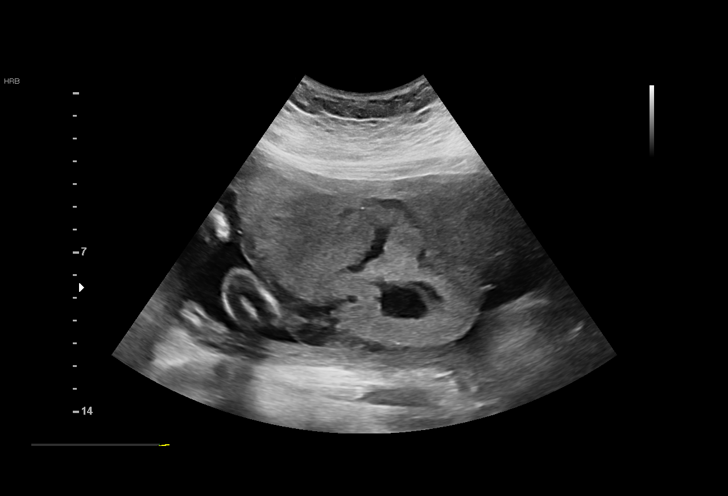
[im 30/39]
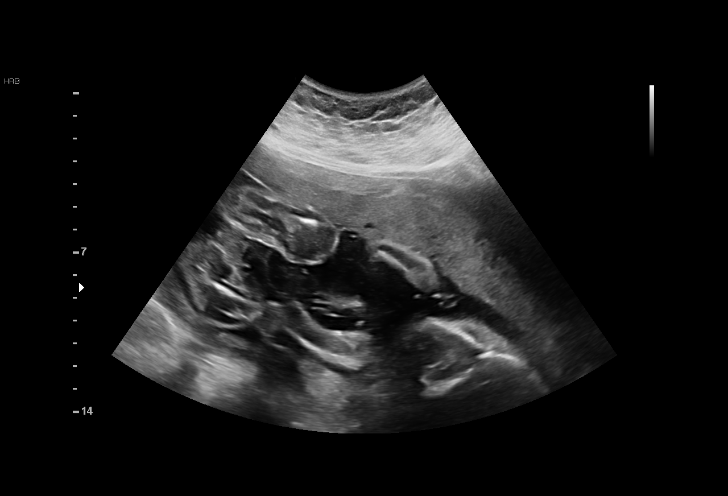
[im 33/39]
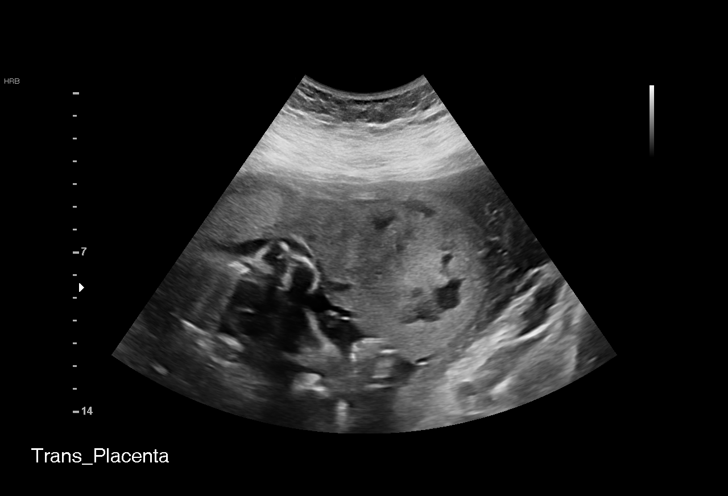
[im 36/39]
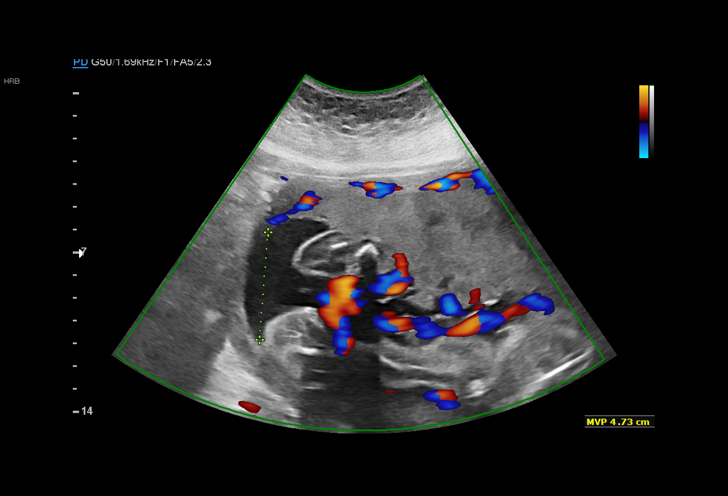
[im 39/39]
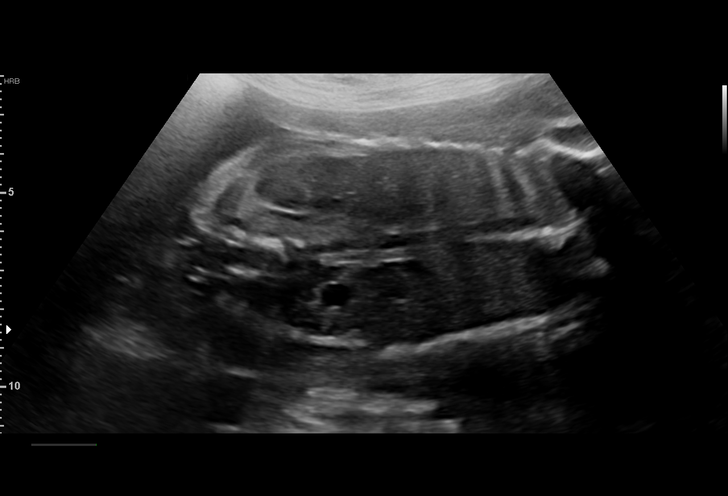

[14 of 28 positions shown; findings below may reference images not displayed]

[REDACTED]-
Faculty Physician

Indications

25 weeks gestation of pregnancy
Antenatal screening for malformations
Elevated MSAFP (3.52)
Poor obstetrical history (molar pregnancy)
OB History

Gravidity:    3         Term:   1        Prem:   0        SAB:   1
TOP:          0       Ectopic:  0        Living: 1
Fetal Evaluation

Num Of Fetuses:     1
Fetal Heart         146
Rate(bpm):
Cardiac Activity:   Observed
Presentation:       Cephalic
Placenta:           Anterior
P. Cord Insertion:  Previously Visualized

Amniotic Fluid
AFI FV:      Subjectively within normal limits

Largest Pocket(cm)
4.73
Biometry

BPD:      65.4  mm     G. Age:  26w 3d         86  %    CI:        76.27   %    70 - 86
FL/HC:      18.6   %    18.7 -
HC:      237.3  mm     G. Age:  25w 6d         57  %    HC/AC:      1.10        1.04 -
AC:       215   mm     G. Age:  26w 0d         72  %    FL/BPD:     67.4   %    71 - 87
FL:       44.1  mm     G. Age:  24w 4d         23  %    FL/AC:      20.5   %    20 - 24
HUM:      41.7  mm     G. Age:  25w 1d         47  %

Est. FW:     813  gm    1 lb 13 oz      62  %
Gestational Age

LMP:           24w 6d        Date:  10/16/17                 EDD:   07/23/18
U/S Today:     25w 5d                                        EDD:   07/17/18
Best:          25w 0d     Det. By:  U/S C R L  (01/15/18)    EDD:   07/22/18
Anatomy

Cranium:               Appears normal         LVOT:                   Appears normal
Cavum:                 Appears normal         Aortic Arch:            Previously seen
Ventricles:            Appears normal         Ductal Arch:            Previously seen
Choroid Plexus:        Previously seen        Diaphragm:              Appears normal
Cerebellum:            Previously seen        Stomach:                Appears normal, left
sided
Posterior Fossa:       Previously seen        Abdomen:                Appears normal
Nuchal Fold:           Not applicable (>20    Abdominal Wall:         Previously seen
wks GA)
Face:                  Orbits and profile     Cord Vessels:           Previously seen
previously seen
Lips:                  Previously seen        Bladder:                Appears normal
Thoracic:              Appears normal         Spine:                  Previously seen
Heart:                 Appears normal         Upper Extremities:      Previously seen
(4CH, axis, and situs
RVOT:                  Appears normal         Lower Extremities:      Previously seen

Other:  Male gender.. Heels previously visualized. Nasal bone previously
visualized.
Cervix Uterus Adnexa

Cervix
Normal appearance by transabdominal scan.
Impression

Increased MSAFP.
Fetal growth is appropriate for gestational age. Amniotic fluid
is normal and good fetal activity is seen.

We reassured the patient of the findings.
Recommendations

An appointment was made for her to return in 4 weeks for
fetal growth assessment.

## 2021-02-27 DIAGNOSIS — H5213 Myopia, bilateral: Secondary | ICD-10-CM | POA: Diagnosis not present

## 2021-05-18 DIAGNOSIS — H5213 Myopia, bilateral: Secondary | ICD-10-CM | POA: Diagnosis not present

## 2021-05-26 DIAGNOSIS — H5213 Myopia, bilateral: Secondary | ICD-10-CM | POA: Diagnosis not present

## 2022-10-15 DIAGNOSIS — Z1329 Encounter for screening for other suspected endocrine disorder: Secondary | ICD-10-CM | POA: Diagnosis not present

## 2022-10-15 DIAGNOSIS — R7303 Prediabetes: Secondary | ICD-10-CM | POA: Diagnosis not present

## 2022-10-15 DIAGNOSIS — R7309 Other abnormal glucose: Secondary | ICD-10-CM | POA: Diagnosis not present

## 2022-10-15 DIAGNOSIS — N951 Menopausal and female climacteric states: Secondary | ICD-10-CM | POA: Diagnosis not present

## 2022-10-15 DIAGNOSIS — E559 Vitamin D deficiency, unspecified: Secondary | ICD-10-CM | POA: Diagnosis not present

## 2022-10-15 DIAGNOSIS — K5900 Constipation, unspecified: Secondary | ICD-10-CM | POA: Diagnosis not present

## 2022-10-15 DIAGNOSIS — Z6841 Body Mass Index (BMI) 40.0 and over, adult: Secondary | ICD-10-CM | POA: Diagnosis not present

## 2022-10-15 DIAGNOSIS — Z130 Encounter for screening for diseases of the blood and blood-forming organs and certain disorders involving the immune mechanism: Secondary | ICD-10-CM | POA: Diagnosis not present

## 2022-10-15 DIAGNOSIS — E7800 Pure hypercholesterolemia, unspecified: Secondary | ICD-10-CM | POA: Diagnosis not present

## 2022-10-23 DIAGNOSIS — E559 Vitamin D deficiency, unspecified: Secondary | ICD-10-CM | POA: Diagnosis not present

## 2022-10-23 DIAGNOSIS — Z6841 Body Mass Index (BMI) 40.0 and over, adult: Secondary | ICD-10-CM | POA: Diagnosis not present

## 2022-10-23 DIAGNOSIS — E7800 Pure hypercholesterolemia, unspecified: Secondary | ICD-10-CM | POA: Diagnosis not present

## 2022-10-23 DIAGNOSIS — Z1339 Encounter for screening examination for other mental health and behavioral disorders: Secondary | ICD-10-CM | POA: Diagnosis not present

## 2022-10-23 DIAGNOSIS — R7309 Other abnormal glucose: Secondary | ICD-10-CM | POA: Diagnosis not present

## 2022-10-23 DIAGNOSIS — R635 Abnormal weight gain: Secondary | ICD-10-CM | POA: Diagnosis not present

## 2022-10-23 DIAGNOSIS — Z1331 Encounter for screening for depression: Secondary | ICD-10-CM | POA: Diagnosis not present

## 2022-10-23 DIAGNOSIS — K5900 Constipation, unspecified: Secondary | ICD-10-CM | POA: Diagnosis not present

## 2022-10-23 DIAGNOSIS — N951 Menopausal and female climacteric states: Secondary | ICD-10-CM | POA: Diagnosis not present

## 2022-10-23 DIAGNOSIS — R7303 Prediabetes: Secondary | ICD-10-CM | POA: Diagnosis not present

## 2022-10-28 DIAGNOSIS — E559 Vitamin D deficiency, unspecified: Secondary | ICD-10-CM | POA: Diagnosis not present

## 2022-10-28 DIAGNOSIS — Z6841 Body Mass Index (BMI) 40.0 and over, adult: Secondary | ICD-10-CM | POA: Diagnosis not present

## 2022-11-06 DIAGNOSIS — E559 Vitamin D deficiency, unspecified: Secondary | ICD-10-CM | POA: Diagnosis not present

## 2022-11-06 DIAGNOSIS — R7303 Prediabetes: Secondary | ICD-10-CM | POA: Diagnosis not present

## 2022-11-06 DIAGNOSIS — Z6841 Body Mass Index (BMI) 40.0 and over, adult: Secondary | ICD-10-CM | POA: Diagnosis not present

## 2022-11-18 DIAGNOSIS — Z6841 Body Mass Index (BMI) 40.0 and over, adult: Secondary | ICD-10-CM | POA: Diagnosis not present

## 2022-11-18 DIAGNOSIS — R7303 Prediabetes: Secondary | ICD-10-CM | POA: Diagnosis not present

## 2022-11-18 DIAGNOSIS — R7309 Other abnormal glucose: Secondary | ICD-10-CM | POA: Diagnosis not present

## 2023-07-06 ENCOUNTER — Telehealth: Payer: Self-pay | Admitting: *Deleted

## 2023-07-06 ENCOUNTER — Other Ambulatory Visit: Payer: Self-pay | Admitting: *Deleted

## 2023-07-06 NOTE — Patient Outreach (Signed)
  Medicaid Managed Care   Unsuccessful Attempt Note   07/06/2023 Name: Allison Cameron MRN: [id number] DOB: Dec 06, 1985  Referred by: Patient, No Pcp Per Reason for referral : High Risk Managed Medicaid (Unsuccessful RNCM initial telephone outreach)   An unsuccessful telephone outreach was attempted today. The patient was referred to the case management team for assistance with care management and care coordination.    Follow Up Plan: A HIPAA compliant phone message was left for the patient providing contact information and requesting a return call. and The Managed Medicaid care management team will reach out to the patient again over the next 7 days.    Estanislado Emms RN, BSN [place]  Value-Based Care Institute [facility] Health RN Care Coordinator [phone]

## 2023-07-06 NOTE — Patient Outreach (Signed)
  Care Management   Note  07/06/2023 Name: Allison Cameron MRN: [id number] DOB: 1986/01/08  Allison Cameron is enrolled in a Managed Medicaid plan: Yes. Outreach attempt today was unsuccessful.   A HIPPA compliant phone message was left for the patient providing contact information and requesting a return call.   Estanislado Emms RN, BSN [place]  Value-Based Care Institute [facility] Health RN Care Coordinator [phone]

## 2023-07-06 NOTE — Patient Instructions (Signed)
Visit Information  [name]  - as a part of your Medicaid benefit, you are eligible for care management and care coordination services at no cost or copay. I was unable to reach you by phone today but would be happy to help you with your health related needs. Please feel free to call me @ [phone].   A member of the Managed Medicaid care management team will reach out to you again over the next 7 days.   Estanislado Emms RN, BSN [place]  Value-Based Care Institute [facility] Health RN Care Coordinator [phone]

## 2023-07-14 ENCOUNTER — Other Ambulatory Visit: Payer: Self-pay | Admitting: *Deleted

## 2023-07-14 NOTE — Patient Outreach (Signed)
Medicaid Managed Care   Nurse Care Manager Note  07/14/2023 Name:  Allison Cameron MRN:  [id number] DOB:  [birth year]  Allison Cameron is an 37 y.o. year old female who is a primary patient of Patient, No Pcp Per.  The [facility] Managed Care Coordination team was consulted for assistance with:    Weight Loss  [name] was given information about Medicaid Managed Care Coordination team services today. Allison Cameron Patient agreed to services and verbal consent obtained.  Engaged with patient by telephone for initial visit in response to provider referral for case management and/or care coordination services.   Assessments/Interventions:  Review of past medical history, allergies, medications, health status, including review of consultants reports, laboratory and other test data, was performed as part of comprehensive evaluation and provision of chronic care management services.  SDOH (Social Determinants of Health) assessments and interventions performed: SDOH Interventions    Flowsheet Row Patient Outreach Telephone from 07/14/2023 in [place] POPULATION HEALTH DEPARTMENT  SDOH Interventions   Food Insecurity Interventions Intervention Not Indicated  Housing Interventions Intervention Not Indicated  Transportation Interventions Intervention Not Indicated  Utilities Interventions Intervention Not Indicated       Care Plan  No Known Allergies  Medications Reviewed Today     Reviewed by Heidi Dach, RN (Registered Nurse) on 07/14/23 at 1016  Med List Status: <None>   Medication Order Taking? Sig Documenting Provider Last Dose Status Informant  aspirin EC 81 MG tablet [id number] No Take 1 tablet (81 mg total) by mouth daily.  Patient not taking: Reported on 07/14/2023   [place] Bing, MD Not Taking Active   ibuprofen (ADVIL) 600 MG tablet [id number] No Take 1 tablet (600 mg total) by mouth every 6 (six) hours.  Patient not taking: Reported on 07/14/2023   Aviva Signs, CNM Not Taking Active   oxyCODONE-acetaminophen (PERCOCET/ROXICET) 5-325 MG tablet [id number] No Take 1 tablet by mouth every 4 (four) hours as needed for moderate pain.  Patient not taking: Reported on 07/14/2023   Aviva Signs, CNM Not Taking Active   Prenatal Vit-Fe Fumarate-FA (PRENATAL VITAMIN PLUS LOW IRON) 27-1 MG TABS [id number] No Take 1 tablet by mouth daily.  Patient not taking: Reported on 07/14/2023   Reva Bores, MD Not Taking Active   Prenatal Vit-Fe Fumarate-FA (PREPLUS) 27-1 MG TABS [id number] No Take 1 tablet by mouth daily.  Patient not taking: Reported on 07/14/2023   Reva Bores, MD Not Taking Active            Med Note Orson Aloe, PIA   Thu Apr 07, 2019  8:28 AM) Duplicate Rx            Patient Active Problem List   Diagnosis Date Noted   BMI 40.0-44.9, adult (HCC) 10/25/2018    Conditions to be addressed/monitored per PCP order:   Weight Loss  Care Plan : RN Care Manager Plan of Care  Updates made by Heidi Dach, RN since 07/14/2023 12:00 AM     Problem: Health Managment needs related to Weight Loss      Long-Range Goal: Development of Plan of Care to address Health Managment needs related to Weight Loss   Start Date: 07/14/2023  Expected End Date: 10/12/2023  Note:   Current Barriers:  Knowledge Deficits related to plan of care for management of Weight Loss   RNCM Clinical Goal(s):  Patient will verbalize understanding of plan for management of Weight Loss as evidenced by patient  reports attend all scheduled medical appointments: 09/23/23 as evidenced by provider documentation in EMR continue to work with RN Care Manager to address care management and care coordination needs related to  Weight Loss as evidenced by adherence to CM Team Scheduled appointments through collaboration with RN Care manager, provider, and care team.   Interventions: Evaluation of current treatment plan related to  self management and patient's adherence  to plan as established by provider   Weight Loss Interventions:  (Status:  New goal.) Long Term Goal-goal weight #170-180 Advised patient to discuss with primary care provider options regarding weight management Provided verbal and/or written education to patient re: provider recommended life style modifications  Reviewed recommended dietary changes: avoid fad diets, make small/incremental dietary and exercise changes, eat at the table and avoid eating in front of the TV, plan management of cravings, monitor snacking and cravings in food diary Assessed social determinant of health barriers Assisted patient with scheduling an appointment to establish care with PCP   Advised patient to contact [facility] [phone] for member benefits  Discussed exercise and advised exercising for 30 minutes, 5 days a week  Patient Goals/Self-Care Activities: Attend all scheduled provider appointments Call provider office for new concerns or questions  Exercise 30 minutes daily for 5 days a week  Follow Up Plan:  Telephone follow up appointment with care management team member scheduled for:  09/23/22 at 10:30am      Follow Up:  Patient agrees to Care Plan and Follow-up.  Plan: The Managed Medicaid care management team will reach out to the patient again over the next 60 days.  Date/time of next scheduled RN care management/care coordination outreach:  09/24/23 at 10:30am  Estanislado Emms RN, BSN [place]  Value-Based Care Institute [facility] Health RN Care Coordinator [phone]

## 2023-07-14 NOTE — Patient Instructions (Signed)
Visit Information  Ms. Eugene was given information about Medicaid Managed Care team care coordination services as a part of their Healthy [facility] Medicaid benefit. MAROLYN TROTTER verbally consented to engagement with the [facility] Managed Care team.   If you are experiencing a medical emergency, please call 911 or report to your local emergency department or urgent care.   If you have a non-emergency medical problem during routine business hours, please contact your provider's office and ask to speak with a nurse.   For questions related to your Healthy [facility] health plan, please call: [phone] or visit the homepage here: [email or web address]  If you would like to schedule transportation through your Healthy [facility] plan, please call the following number at least 2 days in advance of your appointment: [phone]  For information about your ride after you set it up, call Ride Assist at [phone]. Use this number to activate a Will Call pickup, or if your transportation is late for a scheduled pickup. Use this number, too, if you need to make a change or cancel a previously scheduled reservation.  If you need transportation services right away, call [phone]. The after-hours call center is staffed 24 hours to handle ride assistance and urgent reservation requests (including discharges) 365 days a year. Urgent trips include sick visits, hospital discharge requests and life-sustaining treatment.  Call the [facility] Line at [phone], at any time, 24 hours a day, 7 days a week. If you are in danger or need immediate medical attention call 911.  If you would like help to quit smoking, call 1-800-QUIT-NOW ((479)643-2574) OR Espaol: 1-855-Djelo-Ya (0-630-160-1093) o para ms informacin haga clic aqu or Text READY to 235-573 to register via text  Ms. [place],   Please see education materials related to Health Maintenance  and weight management provided by MyChart link.  Patient verbalizes understanding of instructions and care plan provided today and agrees to view in MyChart. Active MyChart status and patient understanding of how to access instructions and care plan via MyChart confirmed with patient.     Telephone follow up appointment with Managed Medicaid care management team member scheduled for:09/24/23 at 10:30am  Estanislado Emms RN, BSN [place]  Value-Based Care Institute [facility] Health RN Care Coordinator [phone]   Following is a copy of your plan of care:  Care Plan : RN Care Manager Plan of Care  Updates made by Heidi Dach, RN since 07/14/2023 12:00 AM     Problem: Health Managment needs related to Weight Loss      Long-Range Goal: Development of Plan of Care to address Health Managment needs related to Weight Loss   Start Date: 07/14/2023  Expected End Date: 10/12/2023  Note:   Current Barriers:  Knowledge Deficits related to plan of care for management of Weight Loss   RNCM Clinical Goal(s):  Patient will verbalize understanding of plan for management of Weight Loss as evidenced by patient reports attend all scheduled medical appointments: 09/23/23 as evidenced by provider documentation in EMR continue to work with RN Care Manager to address care management and care coordination needs related to  Weight Loss as evidenced by adherence to CM Team Scheduled appointments through collaboration with RN Care manager, provider, and care team.   Interventions: Evaluation of current treatment plan related to  self management and patient's adherence to plan as established by provider   Weight Loss Interventions:  (Status:  New goal.) Long Term Goal-goal weight #170-180 Advised patient to discuss with primary care  provider options regarding weight management Provided verbal and/or written education to patient re: provider recommended life style modifications  Reviewed recommended  dietary changes: avoid fad diets, make small/incremental dietary and exercise changes, eat at the table and avoid eating in front of the TV, plan management of cravings, monitor snacking and cravings in food diary Assessed social determinant of health barriers Assisted patient with scheduling an appointment to establish care with PCP   Advised patient to contact Healthy [facility] [phone] for member benefits  Discussed exercise and advised exercising for 30 minutes, 5 days a week  Patient Goals/Self-Care Activities: Attend all scheduled provider appointments Call provider office for new concerns or questions  Exercise 30 minutes daily for 5 days a week  Follow Up Plan:  Telephone follow up appointment with care management team member scheduled for:  09/23/22 at 10:30am

## 2023-09-23 ENCOUNTER — Encounter: Payer: Medicaid Other | Admitting: Family

## 2023-09-23 NOTE — Progress Notes (Signed)
 Erroneous encounter-disregard

## 2023-09-24 ENCOUNTER — Other Ambulatory Visit: Payer: Self-pay | Admitting: *Deleted

## 2023-09-24 NOTE — Patient Instructions (Signed)
Visit Information  [name]  - as a part of your Medicaid benefit, you are eligible for care management and care coordination services at no cost or copay. I was unable to reach you by phone today but would be happy to help you with your health related needs. Please feel free to call me @ [phone].   A member of the Managed Medicaid care management team will reach out to you again over the next 7 days.   Estanislado Emms RN, BSN [place]  Value-Based Care Institute [facility] Health RN Care Manager [phone]

## 2023-09-24 NOTE — Patient Outreach (Signed)
  Medicaid Managed Care   Unsuccessful Attempt Note   09/24/2023 Name: ANYE WAYNER MRN: [id number] DOB: 1986-04-29  Referred by: Patient, No Pcp Per Reason for referral : High Risk Managed Medicaid (Unsuccessful RNCM follow up telephone outreach)   An unsuccessful telephone outreach was attempted today. The patient was referred to the case management team for assistance with care management and care coordination.    Follow Up Plan: A HIPAA compliant phone message was left for the patient providing contact information and requesting a return call. and The Managed Medicaid care management team will reach out to the patient again over the next 7 days.    Estanislado Emms RN, BSN [place]  Value-Based Care Institute [facility] Health RN Care Manager [phone]

## 2023-10-08 ENCOUNTER — Encounter: Payer: Medicaid Other | Admitting: Family

## 2023-10-08 NOTE — Progress Notes (Signed)
 Erroneous encounter-disregard

## 2023-10-12 ENCOUNTER — Telehealth: Payer: Self-pay | Admitting: *Deleted

## 2023-10-12 NOTE — Patient Outreach (Signed)
  Medicaid Managed Care   Unsuccessful Outreach Note  10/12/2023 Name: Allison Cameron MRN: [id number] DOB: [birth year]  Referred by: Patient, No Pcp Per Reason for referral : High Risk Managed Medicaid (Unsuccessful RNCM telephone outreach)   A second unsuccessful telephone outreach was attempted today. The patient was referred to the case management team for assistance with care management and care coordination.   Follow Up Plan: A HIPAA compliant phone message was left for the patient providing contact information and requesting a return call.  The care management team will reach out to the patient again over the next 7 days.   Arna Better RN, BSN [place]  Value-Based Care Institute [facility] Health RN Care Manager [phone]

## 2023-10-13 ENCOUNTER — Telehealth: Payer: Self-pay | Admitting: *Deleted

## 2023-10-13 NOTE — Patient Outreach (Signed)
  Medicaid Managed Care   Unsuccessful Outreach Note  10/13/2023 Name: Allison Cameron MRN: [id number] DOB: 1985/11/01  Referred by: Patient, No Pcp Per Reason for referral : High Risk Managed Medicaid (Unsuccessful telephone outreach)   Third unsuccessful telephone outreach was attempted today. The patient was referred to the case management team for assistance with care management and care coordination. The patient's primary care provider has been notified of our unsuccessful attempts to make or maintain contact with the patient. The care management team is pleased to engage with this patient at any time in the future should he/she be interested in assistance from the care management team.   Follow Up Plan: We have been unable to make contact with the patient for follow up. The care management team is available to follow up with the patient after provider conversation with the patient regarding recommendation for care management engagement and subsequent re-referral to the care management team.   Estanislado Emms RN, BSN [place]  Value-Based Care Institute [facility] Health RN Care Manager [phone]
